# Patient Record
Sex: Male | Born: 1955 | Race: White | Hispanic: No | Marital: Married | Smoking: Never smoker
Health system: Southern US, Community
[De-identification: ages and names within clinical notes are randomized; demographics above are authoritative.]

## PROBLEM LIST (undated history)

## (undated) DIAGNOSIS — N529 Male erectile dysfunction, unspecified: Secondary | ICD-10-CM

## (undated) DIAGNOSIS — M109 Gout, unspecified: Secondary | ICD-10-CM

## (undated) HISTORY — DX: Male erectile dysfunction, unspecified: N52.9

## (undated) HISTORY — DX: Gout, unspecified: M10.9

---

## 2014-10-28 ENCOUNTER — Emergency Department
Admission: EM | Admit: 2014-10-28 | Discharge: 2014-10-28 | Disposition: A | Discharge status: Home / Self Care | Payer: 59 | Attending: Emergency Medicine | Admitting: Emergency Medicine

## 2014-10-28 ENCOUNTER — Emergency Department: Payer: 59

## 2014-10-28 ENCOUNTER — Encounter: Payer: Self-pay | Admitting: General Practice

## 2014-10-28 DIAGNOSIS — S99921A Unspecified injury of right foot, initial encounter: Secondary | ICD-10-CM | POA: Diagnosis present

## 2014-10-28 DIAGNOSIS — Y9289 Other specified places as the place of occurrence of the external cause: Secondary | ICD-10-CM | POA: Diagnosis not present

## 2014-10-28 DIAGNOSIS — W010XXA Fall on same level from slipping, tripping and stumbling without subsequent striking against object, initial encounter: Secondary | ICD-10-CM | POA: Insufficient documentation

## 2014-10-28 DIAGNOSIS — Y9389 Activity, other specified: Secondary | ICD-10-CM | POA: Diagnosis not present

## 2014-10-28 DIAGNOSIS — Y998 Other external cause status: Secondary | ICD-10-CM | POA: Diagnosis not present

## 2014-10-28 DIAGNOSIS — S93401A Sprain of unspecified ligament of right ankle, initial encounter: Secondary | ICD-10-CM | POA: Diagnosis not present

## 2014-10-28 MED ORDER — IBUPROFEN 800 MG PO TABS: 800.0000 mg | ORAL_TABLET | Freq: Three times a day (TID) | ORAL | Status: DC | PRN

## 2014-10-28 MED ORDER — TRAMADOL HCL 50 MG PO TABS: 50.0000 mg | ORAL_TABLET | Freq: Four times a day (QID) | ORAL | Status: DC | PRN

## 2014-10-28 NOTE — ED Notes (Signed)
Pt. Arrived to ed from home. Pt reports falling when stepping out of shower today. Painful right foot/ankle. Swelling noted to right foot/ankle. Pt reports increase pain with movement and placing weight on foot. Pt alert and oriented.

## 2014-10-28 NOTE — Discharge Instructions (Signed)
Ankle Sprain  An ankle sprain is an injury to the strong, fibrous tissues (ligaments) that hold your ankle bones together.   HOME CARE   · Put ice on your ankle for 1-2 days or as told by your doctor.  ¨ Put ice in a plastic bag.  ¨ Place a towel between your skin and the bag.  ¨ Leave the ice on for 15-20 minutes at a time, every 2 hours while you are awake.  · Only take medicine as told by your doctor.  · Raise (elevate) your injured ankle above the level of your heart as much as possible for 2-3 days.  · Use crutches if your doctor tells you to. Slowly put your own weight on the affected ankle. Use the crutches until you can walk without pain.  · If you have a plaster splint:  ¨ Do not rest it on anything harder than a pillow for 24 hours.  ¨ Do not put weight on it.  ¨ Do not get it wet.  ¨ Take it off to shower or bathe.  · If given, use an elastic wrap or support stocking for support. Take the wrap off if your toes lose feeling (numb), tingle, or turn cold or blue.  · If you have an air splint:  ¨ Add or let out air to make it comfortable.  ¨ Take it off at night and to shower and bathe.  ¨ Wiggle your toes and move your ankle up and down often while you are wearing it.  GET HELP IF:  · You have rapidly increasing bruising or puffiness (swelling).  · Your toes feel very cold.  · You lose feeling in your foot.  · Your medicine does not help your pain.  GET HELP RIGHT AWAY IF:   · Your toes lose feeling (numb) or turn blue.  · You have severe pain that is increasing.  MAKE SURE YOU:   · Understand these instructions.  · Will watch your condition.  · Will get help right away if you are not doing well or get worse.  Document Released: 09/24/2007 Document Revised: 08/22/2013 Document Reviewed: 10/20/2011  [employer]® Patient Information ©2015 [employer]. This information is not intended to replace advice given to you by your health care provider. Make sure you discuss any questions you have with your health care  provider.

## 2014-10-28 NOTE — ED Provider Notes (Signed)
[facility] Emergency Department Provider Note  ____________________________________________  Time seen: Approximately 11:27 AM  I have reviewed the triage vital signs and the nursing notes.   HISTORY  Chief Complaint Foot Injury and Foot Pain    HPI Evan Valencia is a 59 y.o. male patient complaining of right foot pain edema status post fall and was stepped on a shower today. Patient stated pain increases was trying to bear weight on her right foot and ankle. She rates his pain as a 10 over 10. Patient stated no palliative measures taken prior to arrival since yesterday. Patient refusing pain medication this time.   History reviewed. No pertinent past medical history.  There are no active problems to display for this patient.   History reviewed. No pertinent past surgical history.  Current Outpatient Rx  Name  Route  Sig  Dispense  Refill  . ibuprofen (ADVIL,MOTRIN) 800 MG tablet   Oral   Take 1 tablet (800 mg total) by mouth every 8 (eight) hours as needed for moderate pain.   15 tablet   0   . traMADol (ULTRAM) 50 MG tablet   Oral   Take 1 tablet (50 mg total) by mouth every 6 (six) hours as needed for moderate pain.   12 tablet   0     Allergies Review of patient's allergies indicates no known allergies.  No family history on file.  Social History History  Substance Use Topics  . Smoking status: Never Smoker   . Smokeless tobacco: Current User    Types: Chew  . Alcohol Use: Yes    Review of Systems Constitutional: No fever/chills Eyes: No visual changes. ENT: No sore throat. Cardiovascular: Denies chest pain. Respiratory: Denies shortness of breath. Gastrointestinal: No abdominal pain.  No nausea, no vomiting.  No diarrhea.  No constipation. Genitourinary: Negative for dysuria. Musculoskeletal: Negative for back pain. Skin: Negative for rash. Neurological: Negative for headaches, focal weakness or numbness. 10-point ROS  otherwise negative.  ____________________________________________   PHYSICAL EXAM:  VITAL SIGNS: ED Triage Vitals  Enc Vitals Group     BP 10/28/14 1123 156/101 mmHg     Pulse Rate 10/28/14 1121 80     Resp 10/28/14 1121 18     Temp 10/28/14 1121 98.5 F (36.9 C)     Temp Source 10/28/14 1121 Oral     SpO2 10/28/14 1121 97 %     Weight 10/28/14 1121 200 lb (90.719 kg)     Height 10/28/14 1121 6' (1.829 m)     Head Cir --      Peak Flow --      Pain Score 10/28/14 1122 10     Pain Loc --      Pain Edu? --      Excl. in GC? --     Constitutional: Alert and oriented. Well appearing and in no acute distress. Eyes: Conjunctivae are normal. PERRL. EOMI. Head: Atraumatic. Nose: No congestion/rhinnorhea. Mouth/Throat: Mucous membranes are moist.  Oropharynx non-erythematous. Neck: No stridor.  No cervical spine tenderness to palpation. Hematological/Lymphatic/Immunilogical: No cervical lymphadenopathy. Cardiovascular: Normal rate, regular rhythm. Grossly normal heart sounds.  Good peripheral circulation. Elevated BP Respiratory: Normal respiratory effort.  No retractions. Lungs CTAB. Gastrointestinal: Soft and nontender. No distention. No abdominal bruits. No CVA tenderness. Musculoskeletal: No obvious deformity to the right foot ankle. Moderate edema is apparent. Neurovascular intact. Decreased range of motion with inversion secondary to complain of pain. Patient refused to bear weight. Neurologic:  Normal speech and language. No gross focal neurologic deficits are appreciated. Speech is normal. No gait instability. Skin:  Skin is warm, dry and intact. No rash noted. Psychiatric: Mood and affect are normal. Speech and behavior are normal.  ____________________________________________   LABS (all labs ordered are listed, but only abnormal results are displayed)  Labs Reviewed - No data to  display ____________________________________________  EKG   ____________________________________________  RADIOLOGY no acute findings I, Joni Reining, personally viewed and evaluated these images as part of my medical decision making.   ____________________________________________   PROCEDURES  Procedure(s) performed: None  Critical Care performed: No  ____________________________________________   INITIAL IMPRESSION / ASSESSMENT AND PLAN / ED COURSE  Pertinent labs & imaging results that were available during my care of the patient were reviewed by me and considered in my medical decision making (see chart for details).  Sprain right ankle. Patient placed in a Velcro ankle splint and given crutches for ambulation. Patient given prescription for tramadol and ibuprofen to use as directed. Patient advised follow-up with his family doctor. ____________________________________________   FINAL CLINICAL IMPRESSION(S) / ED DIAGNOSES  Final diagnoses:  Right ankle sprain, initial encounter      Joni Reining, PA-C 10/28/14 1237  Minna Antis, MD 10/28/14 1529

## 2017-01-07 IMAGING — CR DG ANKLE COMPLETE 3+V*R*
1 series · 3 of 3 positions shown · non-contrast
Comparison: None.

CLINICAL DATA: Fall on [REDACTED] getting out of shower, ankle
pain/swelling, history of fracture 25 years ago

EXAM:
RIGHT ANKLE - COMPLETE 3+ VIEW

[Series 1: dg ankle complete right · 0.14mm/px · 3 of 3 slices shown]
[im 1/3]
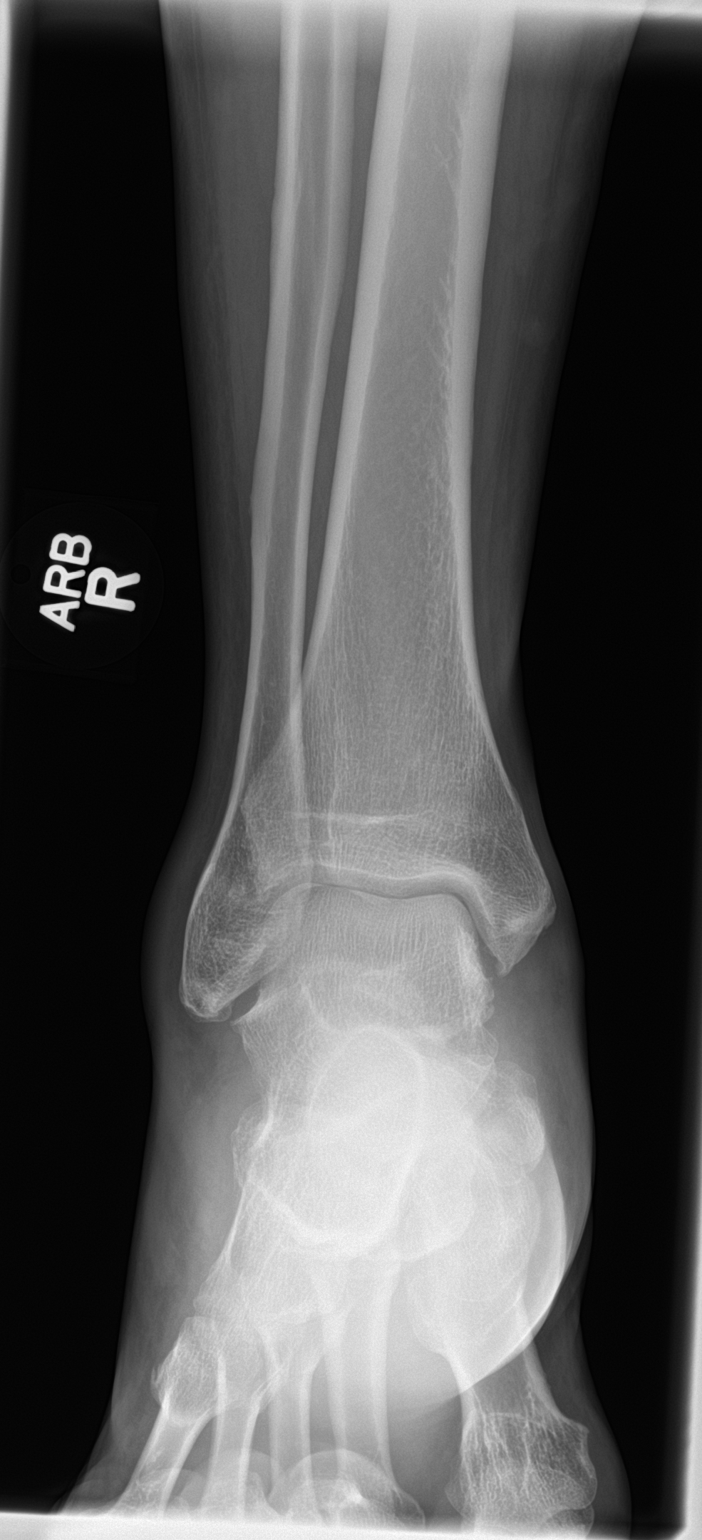
[im 2/3]
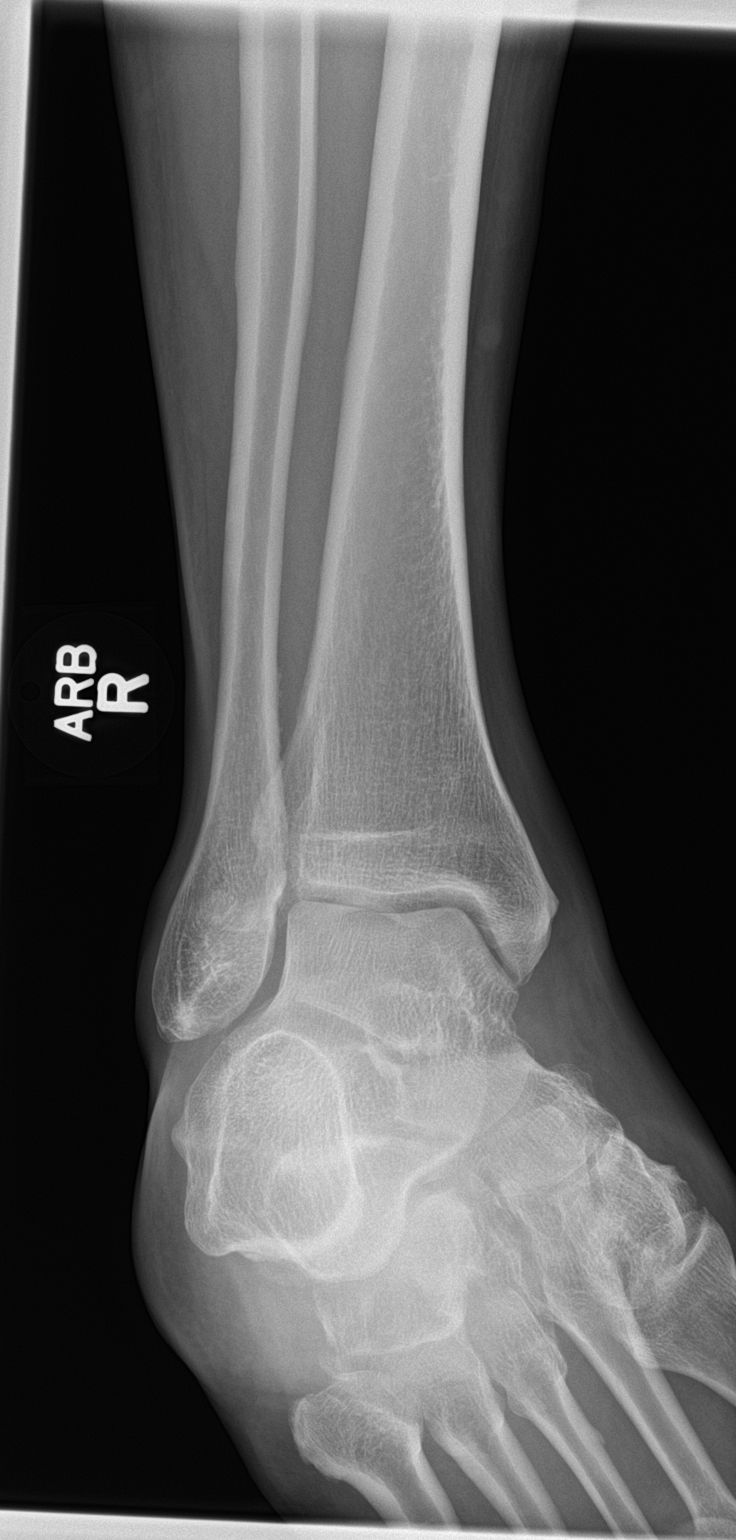
[im 3/3]
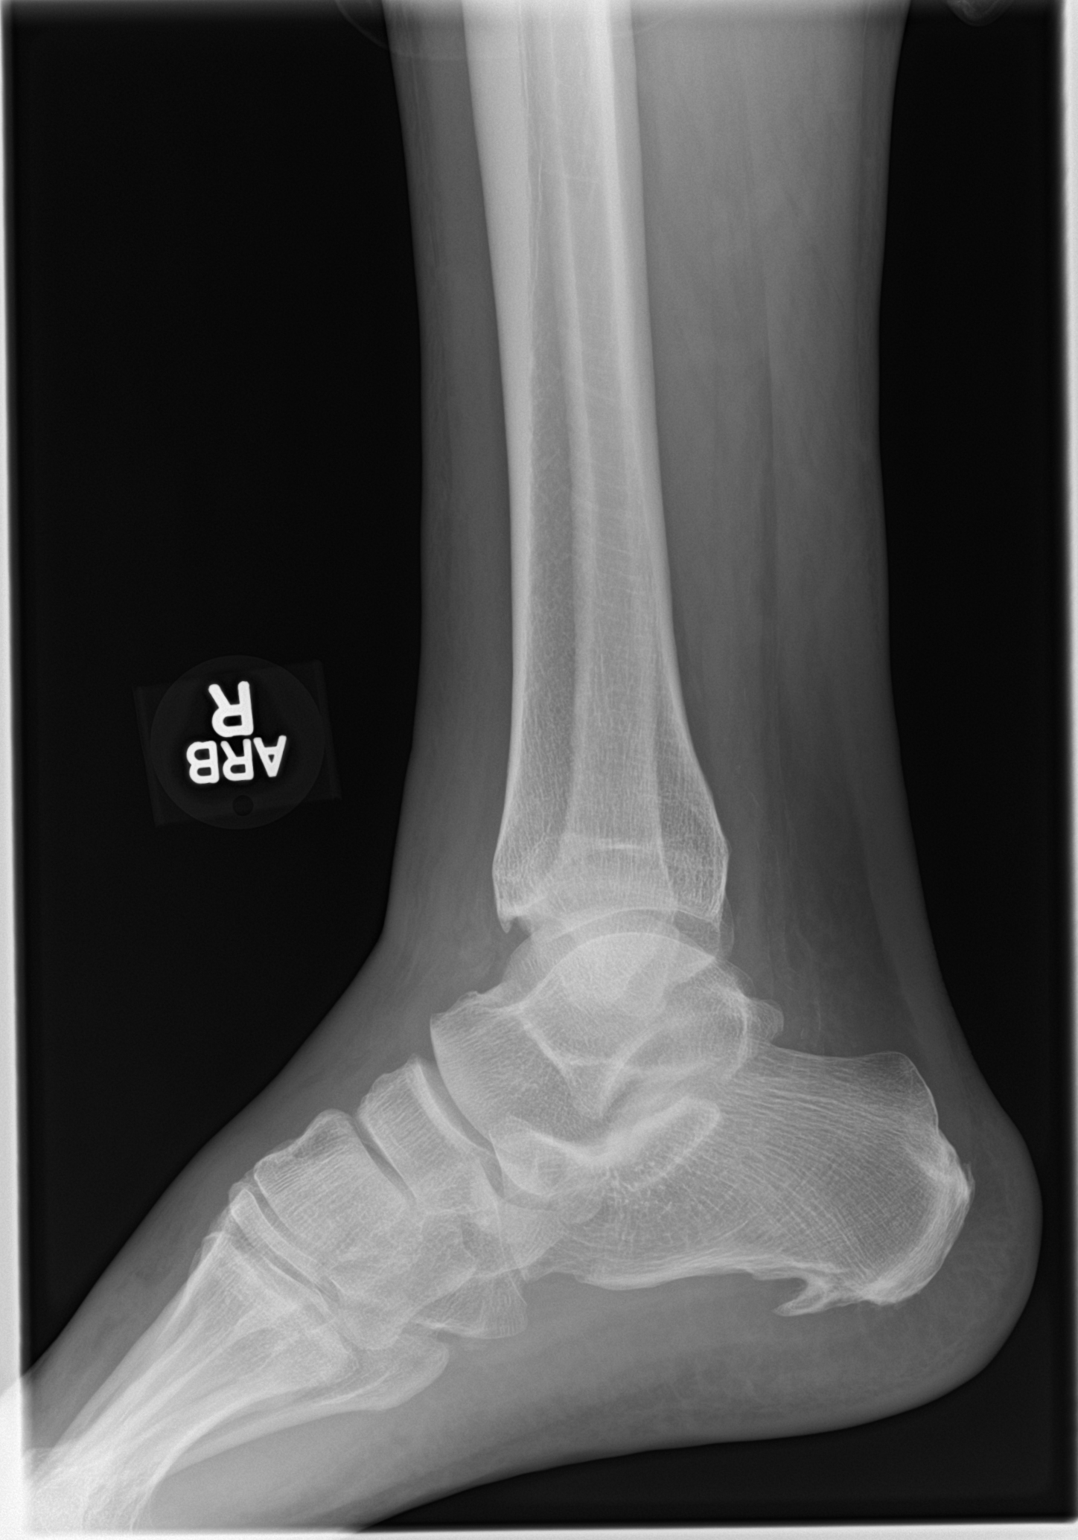

[3 of 3 positions shown; findings below may reference images not displayed]

FINDINGS: No fracture or dislocation is seen.

Mild tibiotalar degenerative changes.

The base of the fifth metatarsal is unremarkable.

Moderate plantar calcaneal enthesophyte.

Mild dorsal/lateral soft tissue swelling.
IMPRESSION: No fracture or dislocation is seen.

Mild tibiotalar degenerative changes.

Mild soft tissue swelling.

## 2021-07-10 ENCOUNTER — Emergency Department: Payer: No Typology Code available for payment source

## 2021-07-10 ENCOUNTER — Emergency Department
Admission: EM | Admit: 2021-07-10 | Discharge: 2021-07-10 | Disposition: A | Discharge status: Home / Self Care | Payer: No Typology Code available for payment source | Attending: Emergency Medicine | Admitting: Emergency Medicine

## 2021-07-10 ENCOUNTER — Encounter: Payer: Self-pay | Admitting: Emergency Medicine

## 2021-07-10 ENCOUNTER — Other Ambulatory Visit: Payer: Self-pay

## 2021-07-10 DIAGNOSIS — M109 Gout, unspecified: Secondary | ICD-10-CM | POA: Insufficient documentation

## 2021-07-10 DIAGNOSIS — M25462 Effusion, left knee: Secondary | ICD-10-CM | POA: Diagnosis not present

## 2021-07-10 DIAGNOSIS — R600 Localized edema: Secondary | ICD-10-CM | POA: Diagnosis not present

## 2021-07-10 DIAGNOSIS — M25562 Pain in left knee: Secondary | ICD-10-CM | POA: Diagnosis present

## 2021-07-10 MED ORDER — PREDNISONE 10 MG PO TABS: ORAL_TABLET | ORAL | 0 refills | Status: AC

## 2021-07-10 MED ORDER — LIDOCAINE HCL (PF) 1 % IJ SOLN
5.0000 mL | Freq: Once | INTRAMUSCULAR | Status: AC
  Administered 2021-07-10: 5 mL
  Filled 2021-07-10: qty 5

## 2021-07-10 NOTE — ED Provider Notes (Signed)
? ?[facility] ?Provider Note ? ? ? Event Date/Time  ? First MD Initiated Contact with Patient 07/10/21 1234   ?  (approximate) ? ? ?History  ? ?Chief Complaint ?Knee Pain ? ? ?HPI ? ?Evan Valencia is a 66 y.o. male with past medical history of gout who presents to the ED complaining of knee pain.  Patient reports that he has been dealing with increasing pain and swelling to his left knee for the past 3 days.  He states that the knee feels hot to touch and similar to prior gout flares.  He states that multiple years ago he was on maintenance medication for gout but has not taken anything recently and does not currently have a PCP.  He has been taking ibuprofen at home without significant relief.  He denies any recent trauma to the knee and has not had any fevers or redness at the knee.  He denies pain or swelling at any other joints. ?  ? ? ?Physical Exam  ? ?Triage Vital Signs: ?ED Triage Vitals  ?Enc Vitals Group  ?   BP 07/10/21 1234 (!) 167/89  ?   Pulse Rate 07/10/21 1234 94  ?   Resp 07/10/21 1234 18  ?   Temp 07/10/21 1234 98.1 ?F (36.7 ?C)  ?   Temp Source 07/10/21 1234 Oral  ?   SpO2 07/10/21 1229 95 %  ?   Weight 07/10/21 1231 240 lb (108.9 kg)  ?   Height 07/10/21 1231 6' (1.829 m)  ?   Head Circumference --   ?   Peak Flow --   ?   Pain Score 07/10/21 1231 10  ?   Pain Loc --   ?   Pain Edu? --   ?   Excl. in GC? --   ? ? ?Most recent vital signs: ?Vitals:  ? 07/10/21 1229 07/10/21 1234  ?BP:  (!) 167/89  ?Pulse:  94  ?Resp:  18  ?Temp:  98.1 ?F (36.7 ?C)  ?SpO2: 95% 98%  ? ? ?Constitutional: Alert and oriented. ?Eyes: Conjunctivae are normal. ?Head: Atraumatic. ?Nose: No congestion/rhinnorhea. ?Mouth/Throat: Mucous membranes are moist.  ?Cardiovascular: Normal rate, regular rhythm. Grossly normal heart sounds.  2+ radial pulses bilaterally. ?Respiratory: Normal respiratory effort.  No retractions. Lungs CTAB. ?Gastrointestinal: Soft and nontender. No  distention. ?Musculoskeletal: Edema, warmth, and diffuse tenderness noted at left knee with significant effusion.  Range of motion at left knee limited secondary to pain however able to range with some discomfort. ?Neurologic:  Normal speech and language. No gross focal neurologic deficits are appreciated. ? ? ? ?ED Results / Procedures / Treatments  ? ?Labs ?(all labs ordered are listed, but only abnormal results are displayed) ?Labs Reviewed - No data to display ? ? ?PROCEDURES: ? ?Critical Care performed: No ? ?.Joint Aspiration/Arthrocentesis ? ?Date/Time: 07/10/2021 1:42 PM ?Performed by: Chesley Noon, MD ?Authorized by: Chesley Noon, MD  ? ?Consent:  ?  Consent obtained:  Verbal ?  Consent given by:  Patient ?  Risks, benefits, and alternatives were discussed: yes   ?  Risks discussed:  Bleeding, infection, pain, poor cosmetic result, nerve damage and incomplete drainage ?  Alternatives discussed:  Alternative treatment, referral and no treatment ?Universal protocol:  ?  Procedure explained and questions answered to patient or proxy's satisfaction: yes   ?  Relevant documents present and verified: yes   ?  Test results available: yes   ?  Imaging studies available: yes   ?  Required blood products, implants, devices, and special equipment available: yes   ?  Site/side marked: yes   ?  Immediately prior to procedure, a time out was called: yes   ?  Patient identity confirmed:  Verbally with patient and arm band ?Location:  ?  Location:  Knee ?  Knee:  L knee ?Anesthesia:  ?  Anesthesia method:  Local infiltration ?  Local anesthetic:  Lidocaine 1% w/o epi ?Procedure details:  ?  Preparation: Patient was prepped and draped in usual sterile fashion   ?  Needle gauge:  18 G ?  Ultrasound guidance: no   ?  Approach:  Medial ?  Aspirate amount:  32 cc ?  Aspirate characteristics:  Serous and yellow ?  Steroid injected: no   ?  Specimen collected: no   ?Post-procedure details:  ?  Dressing:  Sterile dressing and  gauze roll ?  Procedure completion:  Tolerated well, no immediate complications ? ? ?MEDICATIONS ORDERED IN ED: ?Medications  ?lidocaine (PF) (XYLOCAINE) 1 % injection 5 mL (5 mLs Infiltration Given 07/10/21 1315)  ? ? ? ?IMPRESSION / MDM / ASSESSMENT AND PLAN / ED COURSE  ?I reviewed the triage vital signs and the nursing notes. ?             ?               ? ?66 y.o. male with past medical history of gout presents to the ED with 3 days of increasing pain and swelling to his left knee. ? ?Differential diagnosis includes, but is not limited to, gouty arthritis, septic arthritis, osteoarthritis, knee effusion. ? ?Patient is nontoxic-appearing and in no acute distress, is neurovascular intact to his left lower extremity but does have significant swelling, tenderness, and warmth to his left knee.  Given significant effusion on exam, arthrocentesis was performed for therapeutic purposes.  Very low suspicion for septic arthritis at this time given there is no erythema or fever and patient is able to range his left knee although with significant discomfort.  He is appropriate for discharge home with PCP follow-up, will be prescribed a course of steroids and was counseled to return to the ED for new worsening symptoms.  Patient agrees with plan. ? ?  ? ? ?FINAL CLINICAL IMPRESSION(S) / ED DIAGNOSES  ? ?Final diagnoses:  ?Effusion of left knee  ?Acute gout of left knee, unspecified cause  ? ? ? ?Rx / DC Orders  ? ?ED Discharge Orders   ? ?      Ordered  ?  predniSONE (DELTASONE) 10 MG tablet       ? 07/10/21 1341  ? ?  ?  ? ?  ? ? ? ?Note:  This document was prepared using Dragon voice recognition software and may include unintentional dictation errors. ?  ?Chesley Noon, MD ?07/10/21 1346 ? ?

## 2021-07-10 NOTE — ED Triage Notes (Signed)
Patient to ED via [place] from home for left knee pain for the past 3 days. Patient has a history of gout which he has not recently been taking medication for. Redness and swelling noted behind the knee per EMS. ?

## 2021-07-30 ENCOUNTER — Ambulatory Visit: Payer: Self-pay | Admitting: *Deleted

## 2021-07-30 NOTE — Telephone Encounter (Signed)
Left voicemail on cell number as well as home number. Will route to office. ?

## 2021-07-30 NOTE — Telephone Encounter (Addendum)
Left voicemail. He is a new pt. Not sure can be seen earlier but could go to [facility]. ?

## 2021-07-30 NOTE — Telephone Encounter (Signed)
Summary: gout/  ? Wife Evan Valencia calling to ask if there is any way pt can be worked in sooner for an appt.  She says he has gout in his legs.  They had to go to ED a few weeks ago, but pt really needs to be on medication for gout 24/7.  She said "he is getting worse". She did say he can walk, but it is difficult.  Please advise   ?  ? ? ?Called patient to review sx of gout. No answer, LVMTCB [phone]. ?

## 2021-08-29 NOTE — Progress Notes (Signed)
? ?New Patient Office Visit ? ? ?I,[name],acting as a Neurosurgeon for [employer], PA-C.,have documented all relevant documentation on the behalf of Evan Lat, PA-C,as directed by  [employer], PA-C while in the presence of [employer], PA-C. ? ? ?Subjective   ? ?Patient ID: Evan Valencia, male    DOB: 03/22/1956  Age: 66 y.o. MRN: [id number] ? ?CC:  ?Chief Complaint  ?Patient presents with  ? Establish Care  ?     Gout ? ?HPI ?[name] presents to establish care and for management of gout ?Per chart review, patient was seen at the ED on 07/10/21 for evaluation of increasing pain and swelling to his left knee, was diagnosed with acute gout of left knee. Arthrocentesis was performed and patient was d/charged with prednisone Rx ?His wife is accompanied patient. ?Reports that patient has been having some troubles with erection. Patient requests a medication for ED ? ?Outpatient Encounter Medications as of 08/30/2021  ?Medication Sig  ? [DISCONTINUED] ibuprofen (ADVIL,MOTRIN) 800 MG tablet Take 1 tablet (800 mg total) by mouth every 8 (eight) hours as needed for moderate pain. (Patient not taking: Reported on 08/30/2021)  ? [DISCONTINUED] traMADol (ULTRAM) 50 MG tablet Take 1 tablet (50 mg total) by mouth every 6 (six) hours as needed for moderate pain. (Patient not taking: Reported on 08/30/2021)  ? ?No facility-administered encounter medications on file as of 08/30/2021.  ? ? ?Past Medical History:  ?Diagnosis Date  ? ED (erectile dysfunction)   ? Gout   ? ? ?History reviewed. No pertinent surgical history. ? ?Family History  ?Problem Relation Age of Onset  ? Diabetes Mother   ? Diabetes Sister   ? Cancer Brother   ?     Prostate  ? ? ?Social History  ? ?Socioeconomic History  ? Marital status: Married  ?  Spouse name: Not on file  ? Number of children: Not on file  ? Years of education: Not on file  ? Highest education level: Not on file  ?Occupational History  ? Not on file  ?Tobacco Use  ? Smoking  status: Never  ?  Passive exposure: Never  ? Smokeless tobacco: Current  ?  Types: Chew  ?Vaping Use  ? Vaping Use: Never used  ?Substance and Sexual Activity  ? Alcohol use: Yes  ? Drug use: No  ? Sexual activity: Not on file  ?Other Topics Concern  ? Not on file  ?Social History Narrative  ? Not on file  ? ?Social Determinants of Health  ? ?Financial Resource Strain: Not on file  ?Food Insecurity: Not on file  ?Transportation Needs: Not on file  ?Physical Activity: Not on file  ?Stress: Not on file  ?Social Connections: Not on file  ?Intimate Partner Violence: Not on file  ? ? ?Review of Systems  ?Musculoskeletal:  Positive for joint pain.  ?All other systems reviewed and are negative. ? ?  ? ? ?Objective   ? ?BP 137/80 (BP Location: Right Arm, Patient Position: Sitting, Cuff Size: Large)   Pulse 73   Resp 16   Ht 6' (1.829 m)   Wt 210 lb (95.3 kg)   SpO2 99%   BMI 28.48 kg/m?  ? ?Physical Exam ?Vitals reviewed.  ?Constitutional:   ?   Appearance: Normal appearance.  ?HENT:  ?   Head: Normocephalic and atraumatic.  ?Cardiovascular:  ?   Rate and Rhythm: Normal rate and regular rhythm.  ?   Pulses: Normal pulses.  ?  Heart sounds: Normal heart sounds.  ?Pulmonary:  ?   Effort: Pulmonary effort is normal.  ?   Breath sounds: Normal breath sounds.  ?Musculoskeletal:     ?   General: No swelling or tenderness.  ?   Right lower leg: No edema.  ?   Left lower leg: No edema.  ?Neurological:  ?   General: No focal deficit present.  ?   Mental Status: He is alert and oriented to person, place, and time.  ?   Sensory: No sensory deficit.  ?   Motor: No weakness.  ?Psychiatric:     ?   Behavior: Behavior normal.     ?   Thought Content: Thought content normal.     ?   Judgment: Judgment normal.  ? ? ? ?  ? ?Assessment & Plan:  ? ?Problem List Items Addressed This Visit   ?None ?1. Erectile dysfunction, unspecified erectile dysfunction type ?Might Rxed Cialis ?Recommended to have a CPE first before he could be Rx any  medication ?Patient saw a medical provider ~10 years ago ?Labs were done some time ago ? ?2. Acute idiopathic gout, unspecified site ?Recommended supportive care including ice packs, rest, and adequate nutrition and hydration  ?- Uric acid ?- Basic metabolic panel ?Might consider allopurinol or probenecid of colchicine with naproxen or prednisone until urate goal of < 6 ? ?CPE/FU as scheduled ?The patient was advised to call back or seek an in-person evaluation if the symptoms worsen or if the condition fails to improve as anticipated. ? ?I discussed the assessment and treatment plan with the patient. The patient was provided an opportunity to ask questions and all were answered. The patient agreed with the plan and demonstrated an understanding of the instructions. ? ?The entirety of the information documented in the History of Present Illness, Review of Systems and Physical Exam were personally obtained by me. Portions of this information were initially documented by the CMA and reviewed by me for thoroughness and accuracy.  ?Portions of this note were created using dictation software and may contain typographical errors.  ? ? ?April Hyacinth Meeker, CMA ? ? ?

## 2021-08-30 ENCOUNTER — Encounter: Payer: Self-pay | Admitting: Physician Assistant

## 2021-08-30 ENCOUNTER — Ambulatory Visit (INDEPENDENT_AMBULATORY_CARE_PROVIDER_SITE_OTHER): Payer: No Typology Code available for payment source | Admitting: Physician Assistant

## 2021-08-30 VITALS — BP 137/80 | HR 73 | Resp 16 | Ht 72.0 in | Wt 210.0 lb

## 2021-08-30 DIAGNOSIS — N529 Male erectile dysfunction, unspecified: Secondary | ICD-10-CM

## 2021-08-30 DIAGNOSIS — M1000 Idiopathic gout, unspecified site: Secondary | ICD-10-CM | POA: Diagnosis not present

## 2021-08-31 DIAGNOSIS — M1000 Idiopathic gout, unspecified site: Secondary | ICD-10-CM | POA: Insufficient documentation

## 2021-08-31 DIAGNOSIS — N529 Male erectile dysfunction, unspecified: Secondary | ICD-10-CM | POA: Insufficient documentation

## 2021-08-31 LAB — BASIC METABOLIC PANEL
BUN/Creatinine Ratio: 17 (ref 10–24)
BUN: 15 mg/dL (ref 8–27)
CO2: 23 mmol/L (ref 20–29)
Calcium: 9.5 mg/dL (ref 8.6–10.2)
Chloride: 105 mmol/L (ref 96–106)
Creatinine, Ser: 0.87 mg/dL (ref 0.76–1.27)
Glucose: 82 mg/dL (ref 70–99)
Potassium: 4.6 mmol/L (ref 3.5–5.2)
Sodium: 142 mmol/L (ref 134–144)
eGFR: 96 mL/min/{1.73_m2} (ref >59)

## 2021-08-31 LAB — URIC ACID: Uric Acid: 7.2 mg/dL (ref 3.8–8.4)

## 2021-09-05 NOTE — Progress Notes (Signed)
   Complete physical exam   Patient: Evan Valencia   DOB: 03/27/1956   65 y.o. Male  MRN: [id number] Visit Date: 09/06/2021  Today's healthcare provider: Janna Ostwalt, PA-C   Chief Complaint  Patient presents with   Annual Exam   Subjective     HPI Evan Valencia is a 65 y.o. male who presents today for a complete physical exam.  He reports consuming a general diet. The patient has a physically strenuous job, but has no regular exercise apart from work.  He generally feels well. He reports sleeping well. He does not have additional problems to discuss today.   Has problems with starting and maintaining erection. Denies having any urinary symptoms Has been taking Viagra in the past  Has been having a gout flare on 07/10/21 Per chart review, patient was seen at the ED on 07/10/21 for evaluation of increasing pain and swelling to his left knee, was diagnosed with acute gout of left knee. Arthrocentesis was performed and patient was d/charged with prednisone Rx Requests medications for gout control  Past Medical History:  Diagnosis Date   ED (erectile dysfunction)    Gout    No past surgical history on file. Social History   Socioeconomic History   Marital status: Married    Spouse name: Not on file   Number of children: Not on file   Years of education: Not on file   Highest education level: Not on file  Occupational History   Not on file  Tobacco Use   Smoking status: Never    Passive exposure: Never   Smokeless tobacco: Current    Types: Chew  Vaping Use   Vaping Use: Never used  Substance and Sexual Activity   Alcohol use: Yes   Drug use: No   Sexual activity: Not on file  Other Topics Concern   Not on file  Social History Narrative   Not on file   Social Determinants of Health   Financial Resource Strain: Not on file  Food Insecurity: Not on file  Transportation Needs: Not on file  Physical Activity: Not on file  Stress: Not on file  Social  Connections: Not on file  Intimate Partner Violence: Not on file   Family Status  Relation Name Status   Mother  (Not Specified)   Sister  (Not Specified)   Brother  (Not Specified)   Family History  Problem Relation Age of Onset   Diabetes Mother    Diabetes Sister    Cancer Brother        Prostate   No Known Allergies  Patient Care Team: Ostwalt, Janna, PA-C as PCP - General (Physician Assistant)   Medications: No outpatient medications prior to visit.   No facility-administered medications prior to visit.    Review of Systems  Constitutional: Negative.   HENT: Negative.    Eyes: Negative.   Respiratory: Negative.    Cardiovascular: Negative.   Gastrointestinal: Negative.   Endocrine: Negative.   Genitourinary: Negative.   Musculoskeletal:  Positive for back pain.  Skin: Negative.   Allergic/Immunologic: Negative.   Neurological: Negative.   Hematological:  Bruises/bleeds easily.  Psychiatric/Behavioral: Negative.    All other systems reviewed and are negative.  Last metabolic panel Lab Results  Component Value Date   GLUCOSE 96 09/06/2021   NA 145 (H) 09/06/2021   K 5.1 09/06/2021   CL 106 09/06/2021   CO2 23 09/06/2021   BUN 13 09/06/2021   CREATININE 0.82   09/06/2021   EGFR 98 09/06/2021   CALCIUM 9.5 09/06/2021   PROT 7.0 09/06/2021   ALBUMIN 4.4 09/06/2021   LABGLOB 2.6 09/06/2021   AGRATIO 1.7 09/06/2021   BILITOT 0.4 09/06/2021   ALKPHOS 105 09/06/2021   AST 25 09/06/2021   ALT 19 09/06/2021      Objective     BP 140/78 (BP Location: Right Arm, Patient Position: Sitting, Cuff Size: Large)   Pulse 67   Temp 97.9 F (36.6 C) (Oral)   Resp 16   Ht 6' (1.829 m)   Wt 212 lb 11.2 oz (96.5 kg)   SpO2 100%   BMI 28.85 kg/m  BP Readings from Last 3 Encounters:  09/06/21 140/78  08/30/21 137/80  07/10/21 (!) 167/89   Wt Readings from Last 3 Encounters:  09/06/21 212 lb 11.2 oz (96.5 kg)  08/30/21 210 lb (95.3 kg)  07/10/21 240 lb  (108.9 kg)       Physical Exam Vitals reviewed.  Constitutional:      General: He is not in acute distress.    Appearance: Normal appearance. He is well-developed. He is not ill-appearing or diaphoretic.  HENT:     Head: Normocephalic and atraumatic.     Right Ear: Tympanic membrane, ear canal and external ear normal.     Left Ear: Tympanic membrane, ear canal and external ear normal.     Nose: Nose normal.     Mouth/Throat:     Mouth: Mucous membranes are moist.     Pharynx: Oropharynx is clear. No oropharyngeal exudate or posterior oropharyngeal erythema.  Eyes:     General: No scleral icterus.       Right eye: No discharge.        Left eye: No discharge.     Conjunctiva/sclera: Conjunctivae normal.     Pupils: Pupils are equal, round, and reactive to light.  Neck:     Thyroid: No thyromegaly.  Cardiovascular:     Rate and Rhythm: Normal rate and regular rhythm.     Pulses: Normal pulses.     Heart sounds: Normal heart sounds. No murmur heard. Pulmonary:     Effort: Pulmonary effort is normal. No respiratory distress.     Breath sounds: Normal breath sounds. No wheezing or rales.  Abdominal:     General: Abdomen is flat. Bowel sounds are normal. There is no distension.     Palpations: Abdomen is soft.     Tenderness: There is no abdominal tenderness.  Musculoskeletal:        General: No deformity.     Cervical back: Neck supple.     Right lower leg: No edema.     Left lower leg: No edema.  Lymphadenopathy:     Cervical: No cervical adenopathy.  Skin:    General: Skin is warm and dry.     Capillary Refill: Capillary refill takes less than 2 seconds.     Findings: No rash.  Neurological:     General: No focal deficit present.     Mental Status: He is alert and oriented to person, place, and time. Mental status is at baseline.     Sensory: No sensory deficit.     Motor: No weakness.     Gait: Gait normal.  Psychiatric:        Mood and Affect: Mood normal.         Behavior: Behavior normal.        Thought Content: Thought content normal.          Judgment: Judgment normal.    Last depression screening scores    08/30/2021    2:09 PM  PHQ 2/9 Scores  PHQ - 2 Score 0  PHQ- 9 Score 0   Last fall risk screening    08/30/2021    2:09 PM  Fall Risk   Falls in the past year? 1  Number falls in past yr: 1  Comment 2  Injury with Fall? 0  Risk for fall due to : History of fall(s)  Follow up Falls evaluation completed   Last Audit-C alcohol use screening    08/30/2021    2:08 PM  Alcohol Use Disorder Test (AUDIT)  1. How often do you have a drink containing alcohol? 4  2. How many drinks containing alcohol do you have on a typical day when you are drinking? 0  3. How often do you have six or more drinks on one occasion? 0  AUDIT-C Score 4  4. How often during the last year have you found that you were not able to stop drinking once you had started? 0  5. How often during the last year have you failed to do what was normally expected from you because of drinking? 0  6. How often during the last year have you needed a first drink in the morning to get yourself going after a heavy drinking session? 0  7. How often during the last year have you had a feeling of guilt of remorse after drinking? 0  8. How often during the last year have you been unable to remember what happened the night before because you had been drinking? 0  9. Have you or someone else been injured as a result of your drinking? 0  10. Has a relative or friend or a doctor or another health worker been concerned about your drinking or suggested you cut down? 0  Alcohol Use Disorder Identification Test Final Score (AUDIT) 4   A score of 3 or more in women, and 4 or more in men indicates increased risk for alcohol abuse, EXCEPT if all of the points are from question 1   Results for orders placed or performed in visit on 09/06/21  CBC  Result Value Ref Range   WBC 7.1 3.4 - 10.8  x10E3/uL   RBC 4.85 4.14 - 5.80 x10E6/uL   Hemoglobin 14.8 13.0 - 17.7 g/dL   Hematocrit 44.9 37.5 - 51.0 %   MCV 93 79 - 97 fL   MCH 30.5 26.6 - 33.0 pg   MCHC 33.0 31.5 - 35.7 g/dL   RDW 13.3 11.6 - 15.4 %   Platelets 271 150 - 450 x10E3/uL  PSA  Result Value Ref Range   Prostate Specific Ag, Serum 1.1 0.0 - 4.0 ng/mL  TSH  Result Value Ref Range   TSH 2.650 0.450 - 4.500 uIU/mL  Lipid panel  Result Value Ref Range   Cholesterol, Total 188 100 - 199 mg/dL   Triglycerides 113 0 - 149 mg/dL   HDL 42 >39 mg/dL   VLDL Cholesterol Cal 20 5 - 40 mg/dL   LDL Chol Calc (NIH) 126 (H) 0 - 99 mg/dL   Chol/HDL Ratio 4.5 0.0 - 5.0 ratio  Hepatitis C Antibody  Result Value Ref Range   Hep C Virus Ab Non Reactive Non Reactive  Hemoglobin A1c  Result Value Ref Range   Hgb A1c MFr Bld 5.4 4.8 - 5.6 %   Est. average glucose Bld gHb Est-mCnc 108 mg/dL    Comprehensive metabolic panel  Result Value Ref Range   Glucose 96 70 - 99 mg/dL   BUN 13 8 - 27 mg/dL   Creatinine, Ser 0.82 0.76 - 1.27 mg/dL   eGFR 98 >59 mL/min/1.73   BUN/Creatinine Ratio 16 10 - 24   Sodium 145 (H) 134 - 144 mmol/L   Potassium 5.1 3.5 - 5.2 mmol/L   Chloride 106 96 - 106 mmol/L   CO2 23 20 - 29 mmol/L   Calcium 9.5 8.6 - 10.2 mg/dL   Total Protein 7.0 6.0 - 8.5 g/dL   Albumin 4.4 3.8 - 4.8 g/dL   Globulin, Total 2.6 1.5 - 4.5 g/dL   Albumin/Globulin Ratio 1.7 1.2 - 2.2   Bilirubin Total 0.4 0.0 - 1.2 mg/dL   Alkaline Phosphatase 105 44 - 121 IU/L   AST 25 0 - 40 IU/L   ALT 19 0 - 44 IU/L    Assessment & Plan    Routine Health Maintenance and Physical Exam  Exercise Activities and Dietary recommendations  Goals   None     Reviewed patient's Family Medical History Reviewed and updated list of patient's medical providers Established a written schedule for health screening services Health Risk Assessent Completed and Reviewed     Immunization History  Administered Date(s) Administered   Tdap  09/06/2021   Zoster Recombinat (Shingrix) 09/06/2021    Health Maintenance  Topic Date Due   COVID-19 Vaccine (1) 09/22/2021 (Originally 03/13/1956)   HIV Screening  09/09/2022 (Originally 09/11/1971)   COLONOSCOPY (Pts 45-32yr Insurance coverage will need to be confirmed)  10/10/2022 (Originally 09/10/2001)   Zoster Vaccines- Shingrix (2 of 2) 11/01/2021   INFLUENZA VACCINE  11/19/2021   TETANUS/TDAP  09/07/2031   Hepatitis C Screening  Completed   HPV VACCINES  Aged Out    Discussed health benefits of physical activity, and encouraged him to engage in regular exercise appropriate for his age and condition.  1. Encounter for annual health examination  - Tdap vaccine greater than or equal to 7yo IM - Varicella-zoster vaccine IM - CBC - PSA - TSH - Lipid panel - Hepatitis C Antibody - Hemoglobin A1c - Comprehensive metabolic panel  2. Need for Tdap vaccination  - Tdap vaccine greater than or equal to 7yo IM  3. Need for shingles vaccine  - Varicella-zoster vaccine IM  4. Encounter for hepatitis C screening test for low risk patient  - Hepatitis C Antibody  5. Idiopathic gout, unspecified chronicity, unspecified site  - colchicine 0.6 MG tablet; Take 1 tablet (0.6 mg total) by mouth daily.  Dispense: 30 tablet; Refill: 0 - allopurinol (ZYLOPRIM) 100 MG tablet; Take 1 tablet (100 mg total) by mouth daily.  Dispense: 30 tablet; Refill: 6 - CBC FU in 1 mo/BMP and uric acid  6. Erectile dysfunction, unspecified erectile dysfunction type  - tadalafil (CIALIS) 5 MG tablet; Take 1 tablet (5 mg total) by mouth daily.  Dispense: 30 tablet; Refill: 0 - Lipid panel - Hemoglobin A1c FU in 1 mo  7. Prostate cancer screening  - PSA  8. Tobacco chew use Strongly recommended tobacco cessation. Patient is reluctant. Risks of tobacco chew use was explained.  9. Alcohol use Strongly encouraged on alcohol cessation  Patient is reluctant. - TSH - Comprehensive metabolic  panel  10. Screening for colon cancer Will provide screening test at his next visit if he agrees.  FU in 1 mo for colon cancer screening, med screening    The patient was advised  to call back or seek an in-person evaluation if the symptoms worsen or if the condition fails to improve as anticipated.  I discussed the assessment and treatment plan with the patient. The patient was provided an opportunity to ask questions and all were answered. The patient agreed with the plan and demonstrated an understanding of the instructions.  The entirety of the information documented in the History of Present Illness, Review of Systems and Physical Exam were personally obtained by me. Portions of this information were initially documented by the CMA and reviewed by me for thoroughness and accuracy.  Portions of this note were created using dictation software and may contain typographical errors.     Janna Ostwalt, PA-C  [facility] [phone] (phone) [phone] (fax)  [facility]   

## 2021-09-06 ENCOUNTER — Encounter: Payer: Self-pay | Admitting: Physician Assistant

## 2021-09-06 ENCOUNTER — Ambulatory Visit (INDEPENDENT_AMBULATORY_CARE_PROVIDER_SITE_OTHER): Payer: No Typology Code available for payment source | Admitting: Physician Assistant

## 2021-09-06 VITALS — BP 140/78 | HR 67 | Temp 97.9°F | Resp 16 | Ht 72.0 in | Wt 212.7 lb

## 2021-09-06 DIAGNOSIS — Z23 Encounter for immunization: Secondary | ICD-10-CM | POA: Diagnosis not present

## 2021-09-06 DIAGNOSIS — Z1159 Encounter for screening for other viral diseases: Secondary | ICD-10-CM | POA: Diagnosis not present

## 2021-09-06 DIAGNOSIS — Z720 Tobacco use: Secondary | ICD-10-CM

## 2021-09-06 DIAGNOSIS — N529 Male erectile dysfunction, unspecified: Secondary | ICD-10-CM

## 2021-09-06 DIAGNOSIS — Z789 Other specified health status: Secondary | ICD-10-CM

## 2021-09-06 DIAGNOSIS — Z0000 Encounter for general adult medical examination without abnormal findings: Secondary | ICD-10-CM

## 2021-09-06 DIAGNOSIS — Z125 Encounter for screening for malignant neoplasm of prostate: Secondary | ICD-10-CM

## 2021-09-06 DIAGNOSIS — M1000 Idiopathic gout, unspecified site: Secondary | ICD-10-CM | POA: Diagnosis not present

## 2021-09-06 MED ORDER — ALLOPURINOL 100 MG PO TABS: 100.0000 mg | ORAL_TABLET | Freq: Every day | ORAL | 6 refills | Status: DC

## 2021-09-06 MED ORDER — COLCHICINE 0.6 MG PO TABS: 0.6000 mg | ORAL_TABLET | Freq: Every day | ORAL | 0 refills | Status: DC

## 2021-09-06 MED ORDER — TADALAFIL 5 MG PO TABS: 5.0000 mg | ORAL_TABLET | Freq: Every day | ORAL | 0 refills | Status: DC

## 2021-09-07 LAB — COMPREHENSIVE METABOLIC PANEL
ALT: 19 IU/L (ref 0–44)
AST: 25 IU/L (ref 0–40)
Albumin/Globulin Ratio: 1.7 (ref 1.2–2.2)
Albumin: 4.4 g/dL (ref 3.8–4.8)
Alkaline Phosphatase: 105 IU/L (ref 44–121)
BUN/Creatinine Ratio: 16 (ref 10–24)
BUN: 13 mg/dL (ref 8–27)
Bilirubin Total: 0.4 mg/dL (ref 0.0–1.2)
CO2: 23 mmol/L (ref 20–29)
Calcium: 9.5 mg/dL (ref 8.6–10.2)
Chloride: 106 mmol/L (ref 96–106)
Creatinine, Ser: 0.82 mg/dL (ref 0.76–1.27)
Globulin, Total: 2.6 g/dL (ref 1.5–4.5)
Glucose: 96 mg/dL (ref 70–99)
Potassium: 5.1 mmol/L (ref 3.5–5.2)
Sodium: 145 mmol/L — ABNORMAL HIGH (ref 134–144)
Total Protein: 7 g/dL (ref 6.0–8.5)
eGFR: 98 mL/min/{1.73_m2} (ref >59)

## 2021-09-07 LAB — CBC
Hematocrit: 44.9 % (ref 37.5–51.0)
Hemoglobin: 14.8 g/dL (ref 13.0–17.7)
MCH: 30.5 pg (ref 26.6–33.0)
MCHC: 33 g/dL (ref 31.5–35.7)
MCV: 93 fL (ref 79–97)
Platelets: 271 10*3/uL (ref 150–450)
RBC: 4.85 x10E6/uL (ref 4.14–5.80)
RDW: 13.3 % (ref 11.6–15.4)
WBC: 7.1 10*3/uL (ref 3.4–10.8)

## 2021-09-07 LAB — HEMOGLOBIN A1C
Est. average glucose Bld gHb Est-mCnc: 108 mg/dL
Hgb A1c MFr Bld: 5.4 % (ref 4.8–5.6)

## 2021-09-07 LAB — HEPATITIS C ANTIBODY: Hep C Virus Ab: NONREACTIVE

## 2021-09-07 LAB — LIPID PANEL
Chol/HDL Ratio: 4.5 ratio (ref 0.0–5.0)
Cholesterol, Total: 188 mg/dL (ref 100–199)
HDL: 42 mg/dL (ref >39)
LDL Chol Calc (NIH): 126 mg/dL — ABNORMAL HIGH (ref 0–99)
Triglycerides: 113 mg/dL (ref 0–149)
VLDL Cholesterol Cal: 20 mg/dL (ref 5–40)

## 2021-09-07 LAB — PSA: Prostate Specific Ag, Serum: 1.1 ng/mL (ref 0.0–4.0)

## 2021-09-07 LAB — TSH: TSH: 2.65 u[IU]/mL (ref 0.450–4.500)

## 2021-09-08 DIAGNOSIS — Z0000 Encounter for general adult medical examination without abnormal findings: Secondary | ICD-10-CM | POA: Insufficient documentation

## 2021-09-08 DIAGNOSIS — Z789 Other specified health status: Secondary | ICD-10-CM | POA: Insufficient documentation

## 2021-09-08 DIAGNOSIS — Z720 Tobacco use: Secondary | ICD-10-CM | POA: Insufficient documentation

## 2021-10-04 ENCOUNTER — Other Ambulatory Visit: Payer: Self-pay | Admitting: Physician Assistant

## 2021-10-04 DIAGNOSIS — N529 Male erectile dysfunction, unspecified: Secondary | ICD-10-CM

## 2021-10-11 ENCOUNTER — Ambulatory Visit (INDEPENDENT_AMBULATORY_CARE_PROVIDER_SITE_OTHER): Payer: No Typology Code available for payment source | Admitting: Physician Assistant

## 2021-10-11 ENCOUNTER — Encounter: Payer: Self-pay | Admitting: Physician Assistant

## 2021-10-11 VITALS — BP 129/79 | HR 75 | Ht 72.0 in | Wt 210.4 lb

## 2021-10-11 DIAGNOSIS — N529 Male erectile dysfunction, unspecified: Secondary | ICD-10-CM | POA: Diagnosis not present

## 2021-10-11 DIAGNOSIS — Z1211 Encounter for screening for malignant neoplasm of colon: Secondary | ICD-10-CM

## 2021-10-11 DIAGNOSIS — Z720 Tobacco use: Secondary | ICD-10-CM

## 2021-10-11 DIAGNOSIS — M1000 Idiopathic gout, unspecified site: Secondary | ICD-10-CM | POA: Diagnosis not present

## 2021-10-11 DIAGNOSIS — Z789 Other specified health status: Secondary | ICD-10-CM

## 2021-10-11 MED ORDER — TADALAFIL 5 MG PO TABS: 5.0000 mg | ORAL_TABLET | Freq: Every day | ORAL | 2 refills | Status: DC

## 2021-10-13 DIAGNOSIS — Z1211 Encounter for screening for malignant neoplasm of colon: Secondary | ICD-10-CM | POA: Insufficient documentation

## 2021-11-22 ENCOUNTER — Telehealth: Payer: Self-pay

## 2021-11-22 NOTE — Telephone Encounter (Signed)
Copied from CRM [id number]. Topic: General - Other >> Nov 22, 2021  3:12 PM Ja-Kwan M wrote: Reason for CRM: Pt spouse reports pt is having a gout flare up and requests a Rx for either prednisone or colchicine to be sent to [id number], [place] - [address] Phone: [phone] Fax: [phone]

## 2021-11-25 NOTE — Telephone Encounter (Signed)
Spoke with patients wife and she verbalized understanding and will speak with her husband and call if he wants to make appointment

## 2022-01-10 ENCOUNTER — Encounter: Payer: Self-pay | Admitting: Physician Assistant

## 2022-01-10 ENCOUNTER — Ambulatory Visit (INDEPENDENT_AMBULATORY_CARE_PROVIDER_SITE_OTHER): Payer: No Typology Code available for payment source | Admitting: Physician Assistant

## 2022-01-10 VITALS — BP 136/7 | HR 68 | Resp 16 | Wt 213.0 lb

## 2022-01-10 DIAGNOSIS — E785 Hyperlipidemia, unspecified: Secondary | ICD-10-CM | POA: Diagnosis not present

## 2022-01-10 DIAGNOSIS — M1000 Idiopathic gout, unspecified site: Secondary | ICD-10-CM | POA: Diagnosis not present

## 2022-01-10 DIAGNOSIS — Z789 Other specified health status: Secondary | ICD-10-CM

## 2022-01-10 DIAGNOSIS — Z720 Tobacco use: Secondary | ICD-10-CM | POA: Diagnosis not present

## 2022-01-10 DIAGNOSIS — E7800 Pure hypercholesterolemia, unspecified: Secondary | ICD-10-CM

## 2022-01-10 DIAGNOSIS — Z1211 Encounter for screening for malignant neoplasm of colon: Secondary | ICD-10-CM

## 2022-01-10 MED ORDER — ATORVASTATIN CALCIUM 10 MG PO TABS: 10.0000 mg | ORAL_TABLET | Freq: Every day | ORAL | 3 refills | Status: DC

## 2022-01-10 NOTE — Progress Notes (Signed)
Established patient visit   Patient: Evan Valencia   DOB: March 18, 1956   66 y.o. Male  MRN: [id number] Visit Date: 01/10/2022  Today's healthcare provider: Mardene Speak, PA-C   Chief Complaint  Patient presents with   Follow-up   Hyperlipidemia   Gout   Subjective    HPI  Lipid/Cholesterol, Follow-up  Last lipid panel Other pertinent labs  Lab Results  Component Value Date   CHOL 188 09/06/2021   HDL 42 09/06/2021   LDLCALC 126 (H) 09/06/2021   TRIG 113 09/06/2021   CHOLHDL 4.5 09/06/2021   Lab Results  Component Value Date   ALT 19 09/06/2021   AST 25 09/06/2021   PLT 271 09/06/2021   TSH 2.650 09/06/2021     He was last seen for this 3 months ago.  Management since that visit includes; given free samples were provided: Livolo 2 mg - 1/2 tab daily for the next 28 days.   The 10-year ASCVD risk score (Arnett DK, et al., 2019) is: 14.9%  Patient did try samples and did well with the medication. --------------------------------------------------------------------------------------------------- Follow up for Gout:  The patient was last seen for this 3 months ago. Changes made at last visit include; taking allopurinol 100 mg.  Continue to chew tobacco daily Continue to drink beer -----------------------------------------------------------------------------------------   Medications: Outpatient Medications Prior to Visit  Medication Sig   allopurinol (ZYLOPRIM) 100 MG tablet Take 1 tablet (100 mg total) by mouth daily.   colchicine 0.6 MG tablet Take 1 tablet (0.6 mg total) by mouth daily.   tadalafil (CIALIS) 5 MG tablet Take 1 tablet (5 mg total) by mouth daily.   No facility-administered medications prior to visit.    Review of Systems  All other systems reviewed and are negative.  Except see HPI    Objective    BP (!) 136/7 (BP Location: Right Arm, Patient Position: Sitting, Cuff Size: Large)   Pulse 68   Resp 16   Wt 213 lb (96.6 kg)    SpO2 98%   BMI 28.89 kg/m    Physical Exam Vitals reviewed.  Constitutional:      General: He is not in acute distress.    Appearance: Normal appearance. He is not diaphoretic.  HENT:     Head: Normocephalic and atraumatic.  Eyes:     General: No scleral icterus.    Conjunctiva/sclera: Conjunctivae normal.  Cardiovascular:     Rate and Rhythm: Normal rate and regular rhythm.     Pulses: Normal pulses.     Heart sounds: Normal heart sounds. No murmur heard. Pulmonary:     Effort: Pulmonary effort is normal. No respiratory distress.     Breath sounds: Normal breath sounds. No wheezing or rhonchi.  Musculoskeletal:     Cervical back: Neck supple.     Right lower leg: No edema.     Left lower leg: No edema.  Lymphadenopathy:     Cervical: No cervical adenopathy.  Skin:    General: Skin is warm and dry.     Findings: No rash.  Neurological:     Mental Status: He is alert and oriented to person, place, and time. Mental status is at baseline.  Psychiatric:        Mood and Affect: Mood normal.        Behavior: Behavior normal.        Thought Content: Thought content normal.        Judgment: Judgment normal.  No results found for any visits on 01/10/22.  Assessment & Plan     1. Tobacco chew use Strongly recommended tobacco cessation. Patient is reluctant. Risks of tobacco chew use was explained.  Will reinforce at the next appt  2. Alcohol use Strongly encouraged on alcohol cessation  Patient is reluctant. He "loves his beer."  3. Idiopathic gout, unspecified chronicity, unspecified site Asymptomatic. Last uric acid was 7/2 on 09/05/21 Continue with chronic medication and repeat: - Uric acid - Renal Function Panel  4.  Elevated LDL cholesterol level Livalo will not be covered by his insurance. Let him try Lipitor - atorvastatin (LIPITOR) 10 MG tablet; Take 1 tablet (10 mg total) by mouth daily.  Dispense: 90 tablet; Refill: 3 - Lipid panel Pt was  recommended the following lifestyle modifications: The Mediterranean-style eating plan, alcohol cessation, weight management, regular exercises  5. Screening for colon cancer Will check why pt did not receive his kit after first order was placed - Cologuard   FU in 3 mo for chronic conditions  The patient was advised to call back or seek an in-person evaluation if the symptoms worsen or if the condition fails to improve as anticipated.  I discussed the assessment and treatment plan with the patient. The patient was provided an opportunity to ask questions and all were answered. The patient agreed with the plan and demonstrated an understanding of the instructions.  The entirety of the information documented in the History of Present Illness, Review of Systems and Physical Exam were personally obtained by me. Portions of this information were initially documented by the CMA and reviewed by me for thoroughness and accuracy.  Portions of this note were created using dictation software and may contain typographical errors.   Mardene Speak, PA-C  [facility] [phone] (phone) [phone] (fax)  [place]

## 2022-01-11 LAB — RENAL FUNCTION PANEL
Albumin: 4.5 g/dL (ref 3.9–4.9)
BUN/Creatinine Ratio: 16 (ref 10–24)
BUN: 15 mg/dL (ref 8–27)
CO2: 23 mmol/L (ref 20–29)
Calcium: 9.3 mg/dL (ref 8.6–10.2)
Chloride: 102 mmol/L (ref 96–106)
Creatinine, Ser: 0.91 mg/dL (ref 0.76–1.27)
Glucose: 86 mg/dL (ref 70–99)
Phosphorus: 3 mg/dL (ref 2.8–4.1)
Potassium: 4.2 mmol/L (ref 3.5–5.2)
Sodium: 142 mmol/L (ref 134–144)
eGFR: 94 mL/min/{1.73_m2} (ref >59)

## 2022-01-11 LAB — LIPID PANEL
Chol/HDL Ratio: 3.6 ratio (ref 0.0–5.0)
Cholesterol, Total: 172 mg/dL (ref 100–199)
HDL: 48 mg/dL (ref >39)
LDL Chol Calc (NIH): 97 mg/dL (ref 0–99)
Triglycerides: 154 mg/dL — ABNORMAL HIGH (ref 0–149)
VLDL Cholesterol Cal: 27 mg/dL (ref 5–40)

## 2022-01-11 LAB — URIC ACID: Uric Acid: 7 mg/dL (ref 3.8–8.4)

## 2022-01-13 NOTE — Progress Notes (Signed)
Hello Evan Valencia ,   Your labwork results all are within normal limits Except some elevated TGL but you did not fast. Your uric acid was 7. Goal is less than 6. Please, increase the dose of allopurinol to 200 mg/2 tablets.  Recommended to  Avoid:  Organ meats, red meat, shellfish High-fructose corn syrup-sweetened sodas,  Alcohol overuse (>2 servings per day for men) Encourage:  Coffee intake of >4 cups per day Foods rich in dietary fiber, folate, vitamin C Higher intake of dairy products  Cherry consumption may be of benefit in gout prophylaxis     Any questions please reach out to the office or message me on MyChart!  Best, Mardene Speak, PA-C

## 2022-02-05 ENCOUNTER — Other Ambulatory Visit: Payer: Self-pay | Admitting: Physician Assistant

## 2022-02-05 DIAGNOSIS — N529 Male erectile dysfunction, unspecified: Secondary | ICD-10-CM

## 2022-02-05 NOTE — Telephone Encounter (Signed)
Requested Prescriptions  Pending Prescriptions Disp Refills  . tadalafil (CIALIS) 5 MG tablet [Pharmacy Med Name: TADALAFIL 5 MG TABLET] 30 tablet 2    Sig: TAKE 1 TABLET (5 MG TOTAL) BY MOUTH DAILY.     Urology: Erectile Dysfunction Agents Failed - 02/05/2022  1:54 AM      Failed - Last BP in normal range    BP Readings from Last 1 Encounters:  01/10/22 (!) 136/7         Passed - AST in normal range and within 360 days    AST  Date Value Ref Range Status  09/06/2021 25 0 - 40 IU/L Final         Passed - ALT in normal range and within 360 days    ALT  Date Value Ref Range Status  09/06/2021 19 0 - 44 IU/L Final         Passed - Valid encounter within last 12 months    Recent Outpatient Visits          3 weeks ago Tobacco chew use   [facility] [place], [place], PA-C   3 months ago Screening for colon cancer   [facility], [place], PA-C   5 months ago Sales executive for annual health examination   [employer], [place], PA-C   5 months ago Erectile dysfunction, unspecified erectile dysfunction type   [employer], [place], PA-C      Future Appointments            In 2 months Ostwalt, Letitia Libra, PA-C [employer], [place]

## 2022-03-26 ENCOUNTER — Other Ambulatory Visit: Payer: Self-pay | Admitting: Physician Assistant

## 2022-03-26 DIAGNOSIS — M1000 Idiopathic gout, unspecified site: Secondary | ICD-10-CM

## 2022-04-08 ENCOUNTER — Other Ambulatory Visit: Payer: Self-pay | Admitting: Physician Assistant

## 2022-04-08 DIAGNOSIS — M1000 Idiopathic gout, unspecified site: Secondary | ICD-10-CM

## 2022-04-08 NOTE — Telephone Encounter (Signed)
Rx 03/26/22 #90- too soon Requested Prescriptions  Pending Prescriptions Disp Refills   allopurinol (ZYLOPRIM) 100 MG tablet [Pharmacy Med Name: ALLOPURINOL 100 MG TABLET] 90 tablet 0    Sig: TAKE 1 TABLET BY MOUTH EVERY DAY     Endocrinology:  Gout Agents - allopurinol Failed - 04/08/2022  8:09 AM      Failed - CBC within normal limits and completed in the last 12 months    WBC  Date Value Ref Range Status  09/06/2021 7.1 3.4 - 10.8 x10E3/uL Final   RBC  Date Value Ref Range Status  09/06/2021 4.85 4.14 - 5.80 x10E6/uL Final   Hemoglobin  Date Value Ref Range Status  09/06/2021 14.8 13.0 - 17.7 g/dL Final   Hematocrit  Date Value Ref Range Status  09/06/2021 44.9 37.5 - 51.0 % Final   MCHC  Date Value Ref Range Status  09/06/2021 33.0 31.5 - 35.7 g/dL Final   [facility]  Date Value Ref Range Status  09/06/2021 30.5 26.6 - 33.0 pg Final   MCV  Date Value Ref Range Status  09/06/2021 93 79 - 97 fL Final   No results found for: "PLTCOUNTKUC", "LABPLAT", "POCPLA" RDW  Date Value Ref Range Status  09/06/2021 13.3 11.6 - 15.4 % Final         Passed - Uric Acid in normal range and within 360 days    Uric Acid  Date Value Ref Range Status  01/10/2022 7.0 3.8 - 8.4 mg/dL Final    Comment:               Therapeutic target for gout patients: <6.0         Passed - Cr in normal range and within 360 days    Creatinine, Ser  Date Value Ref Range Status  01/10/2022 0.91 0.76 - 1.27 mg/dL Final         Passed - Valid encounter within last 12 months    Recent Outpatient Visits           2 months ago Tobacco chew use   [facility] [place], [place], PA-C   5 months ago Screening for colon cancer   [employer], [place], PA-C   7 months ago Audiological scientist for annual health examination   [employer], [place], PA-C   7 months ago Erectile dysfunction, unspecified erectile dysfunction type   [employer],  [place], PA-C       Future Appointments             In 3 days [name], PA-C [employer], [facility]

## 2022-04-11 ENCOUNTER — Ambulatory Visit: Payer: No Typology Code available for payment source | Admitting: Family Medicine

## 2022-04-11 ENCOUNTER — Telehealth: Payer: Self-pay

## 2022-04-11 NOTE — Telephone Encounter (Signed)
Copied from CRM [id number]. Topic: Appointment Scheduling - Scheduling Inquiry for Clinic >> Apr 11, 2022  3:06 PM [place] B wrote: Reason for CRM: Patient's wife called in just wanted to left office, they were upset that she says she was told appt was at 3 and they had it written  down and everything, then was told the apt is at 3 instead. She says she just doesn't want this to happen again

## 2022-04-11 NOTE — Telephone Encounter (Signed)
FYI

## 2022-04-11 NOTE — Progress Notes (Deleted)
      Established patient visit   Patient: Evan Valencia   DOB: 13-Jun-1955   66 y.o. Male  MRN: [id number] Visit Date: 04/11/2022  Today's healthcare provider: Mila Merry, MD   No chief complaint on file.  Subjective    HPI  Lipid/Cholesterol, Follow-up  Last lipid panel Other pertinent labs  Lab Results  Component Value Date   CHOL 172 01/10/2022   HDL 48 01/10/2022   LDLCALC 97 01/10/2022   TRIG 154 (H) 01/10/2022   CHOLHDL 3.6 01/10/2022   Lab Results  Component Value Date   ALT 19 09/06/2021   AST 25 09/06/2021   PLT 271 09/06/2021   TSH 2.650 09/06/2021     He was last seen for this 3 months ago.  Management since that visit includes; labs checked showing- within normal limits. --------------------------------------------------------------------------------------------------- Follow up for Idiopathic gout:  The patient was last seen for this 3 months ago. Changes made at last visit include; uric acid was 7. Increased the dose of allopurinol to 200 mg bid.  -----------------------------------------------------------------------------------------   Medications: Outpatient Medications Prior to Visit  Medication Sig   allopurinol (ZYLOPRIM) 100 MG tablet TAKE 1 TABLET BY MOUTH EVERY DAY   atorvastatin (LIPITOR) 10 MG tablet Take 1 tablet (10 mg total) by mouth daily.   colchicine 0.6 MG tablet Take 1 tablet (0.6 mg total) by mouth daily.   tadalafil (CIALIS) 5 MG tablet TAKE 1 TABLET (5 MG TOTAL) BY MOUTH DAILY.   No facility-administered medications prior to visit.    Review of Systems  Constitutional:  Negative for appetite change, chills and fever.  Respiratory:  Negative for chest tightness, shortness of breath and wheezing.   Cardiovascular:  Negative for chest pain and palpitations.  Gastrointestinal:  Negative for abdominal pain, nausea and vomiting.    {Labs  Heme  Chem  Endocrine  Serology  Results Review (optional):23779}    Objective    There were no vitals taken for this visit. {Show previous vital signs (optional):23777}  Physical Exam  ***  No results found for any visits on 04/11/22.  Assessment & Plan     ***  No follow-ups on file.      {provider attestation***:1}   Mila Merry, MD  [facility] [phone] (phone) [phone] (fax)  [facility]

## 2022-05-06 ENCOUNTER — Other Ambulatory Visit: Payer: Self-pay | Admitting: Physician Assistant

## 2022-05-06 DIAGNOSIS — N529 Male erectile dysfunction, unspecified: Secondary | ICD-10-CM

## 2022-05-06 NOTE — Telephone Encounter (Signed)
Requested Prescriptions  Pending Prescriptions Disp Refills   tadalafil (CIALIS) 5 MG tablet [Pharmacy Med Name: TADALAFIL 5 MG TABLET] 90 tablet 0    Sig: TAKE 1 TABLET (5 MG TOTAL) BY MOUTH DAILY.     Urology: Erectile Dysfunction Agents Failed - 05/06/2022  1:32 AM      Failed - Last BP in normal range    BP Readings from Last 1 Encounters:  01/10/22 (!) 136/7         Passed - AST in normal range and within 360 days    AST  Date Value Ref Range Status  09/06/2021 25 0 - 40 IU/L Final         Passed - ALT in normal range and within 360 days    ALT  Date Value Ref Range Status  09/06/2021 19 0 - 44 IU/L Final         Passed - Valid encounter within last 12 months    Recent Outpatient Visits           3 months ago Tobacco chew use   [facility] [place], [place], PA-C   6 months ago Screening for colon cancer   [employer], [place], PA-C   8 months ago Sales executive for annual health examination   [employer], [place], PA-C   8 months ago Erectile dysfunction, unspecified erectile dysfunction type   [employer], [place], PA-C       Future Appointments             In 1 week Fisher, Kirstie Peri, MD [facility], [facility]

## 2022-05-16 ENCOUNTER — Ambulatory Visit (INDEPENDENT_AMBULATORY_CARE_PROVIDER_SITE_OTHER): Payer: No Typology Code available for payment source | Admitting: Family Medicine

## 2022-05-16 ENCOUNTER — Encounter: Payer: Self-pay | Admitting: Family Medicine

## 2022-05-16 VITALS — BP 151/80 | HR 90 | Temp 98.7°F | Wt 233.7 lb

## 2022-05-16 DIAGNOSIS — N529 Male erectile dysfunction, unspecified: Secondary | ICD-10-CM

## 2022-05-16 DIAGNOSIS — Z23 Encounter for immunization: Secondary | ICD-10-CM

## 2022-05-16 DIAGNOSIS — E790 Hyperuricemia without signs of inflammatory arthritis and tophaceous disease: Secondary | ICD-10-CM | POA: Diagnosis not present

## 2022-05-16 MED ORDER — SILDENAFIL CITRATE 50 MG PO TABS: 50.0000 mg | ORAL_TABLET | Freq: Every day | ORAL | 1 refills | Status: DC | PRN

## 2022-05-16 NOTE — Progress Notes (Unsigned)
     I,Connie R Striblin,acting as a Education administrator for Lelon Huh, MD.,have documented all relevant documentation on the behalf of Lelon Huh, MD,as directed by  Lelon Huh, MD while in the presence of Lelon Huh, MD.   Established patient visit   Patient: Evan Valencia   DOB: [birth year]   67 y.o. Male  MRN: [id number] Visit Date: 05/16/2022  Today's healthcare provider: Lelon Huh, MD    Subjective    HPI  Presents today to follow up on hyperuricemia since dose of allopurinol was increased at his last visit.  Lab Results  Component Value Date   LABURIC 7.0 01/10/2022    He states he has had no gout flares and has had not had to take any colchicine since increase dose of allopurinol.   He also states that daily Cialis has not been effective and would like to try Viagra instead.  ---------------------------------------------------------------------------------------------------   Medications: Outpatient Medications Prior to Visit  Medication Sig   allopurinol (ZYLOPRIM) 100 MG tablet TAKE 1 TABLET BY MOUTH EVERY DAY   atorvastatin (LIPITOR) 10 MG tablet Take 1 tablet (10 mg total) by mouth daily.   colchicine 0.6 MG tablet Take 1 tablet (0.6 mg total) by mouth daily.   tadalafil (CIALIS) 5 MG tablet TAKE 1 TABLET (5 MG TOTAL) BY MOUTH DAILY.   No facility-administered medications prior to visit.    Review of Systems  Constitutional:  Negative for appetite change, chills and fever.  Respiratory:  Negative for chest tightness, shortness of breath and wheezing.   Cardiovascular:  Negative for chest pain and palpitations.  Gastrointestinal:  Negative for abdominal pain, nausea and vomiting.    {Labs  Heme  Chem  Endocrine  Serology  Results Review (optional):23779}   Objective    BP (!) 151/80 (BP Location: Left Arm, Patient Position: Sitting, Cuff Size: Large)   Pulse 90   Temp 98.7 F (37.1 C) (Oral)   Wt 233 lb 11.2 oz (106 kg)   SpO2 99%   BMI  31.70 kg/m  {Show previous vital signs (optional):23777}  Physical Exam   General appearance: {[phone]::"Obese"} male, cooperative and in no acute distress Head: Normocephalic, without obvious abnormality, atraumatic Respiratory: Respirations even and unlabored, normal respiratory rate Extremities: All extremities are intact.  Skin: Skin color, texture, turgor normal. No rashes seen  Psych: Appropriate mood and affect. Neurologic: Mental status: Alert, oriented to person, place, and time, thought content appropriate.    Assessment & Plan     1. Hyperuricemia Doing well with increased dose of allopurinol with no gout flares.   - Uric acid  2. Erectile dysfunction, unspecified erectile dysfunction type Cialis has not been effective, will change to sildenafil (VIAGRA) 50 MG tablet; Take 1 tablet (50 mg total) by mouth daily as needed for erectile dysfunction.  Dispense: 30 tablet; Refill: 1   3. Need for shingles vaccine  - Zoster Recombinant (Shingrix ) #2      The entirety of the information documented in the History of Present Illness, Review of Systems and Physical Exam were personally obtained by me. Portions of this information were initially documented by the CMA and reviewed by me for thoroughness and accuracy.     Lelon Huh, MD  [place] [phone] (phone) [phone] (fax)  [place]

## 2022-05-17 LAB — URIC ACID: Uric Acid: 6 mg/dL (ref 3.8–8.4)

## 2022-06-10 ENCOUNTER — Telehealth: Payer: Self-pay | Admitting: *Deleted

## 2022-06-10 NOTE — Telephone Encounter (Signed)
Result note read to pt, verbalizes understanding.    "Your uric acid levels are much better at 6.0. Sometimes we need to get under 5 to get gout under control. Let me know if you have any more gout flares in which case we could increase the allopurinol some more."   Dr. Caryn Section

## 2022-06-12 ENCOUNTER — Encounter: Payer: Self-pay | Admitting: Family Medicine

## 2022-06-17 ENCOUNTER — Other Ambulatory Visit: Payer: Self-pay | Admitting: Physician Assistant

## 2022-06-17 DIAGNOSIS — M1000 Idiopathic gout, unspecified site: Secondary | ICD-10-CM

## 2022-06-17 MED ORDER — ALLOPURINOL 100 MG PO TABS: 100.0000 mg | ORAL_TABLET | Freq: Every day | ORAL | 0 refills | Status: DC

## 2022-06-17 NOTE — Telephone Encounter (Signed)
Medication Refill - Medication: allopurinol (ZYLOPRIM) 100 MG tablet   Has the patient contacted their pharmacy? No. (Agent: If no, request that the patient contact the pharmacy for the refill. If patient does not wish to contact the pharmacy document the reason why and proceed with request.) (Agent: If yes, when and what did the pharmacy advise?)  Preferred Pharmacy (with phone number or street name):  [id number] [address]: [phone] Fax: [phone]    Has the patient been seen for an appointment in the last year OR does the patient have an upcoming appointment? Yes.    Agent: Please be advised that RX refills may take up to 3 business days. We ask that you follow-up with your pharmacy.

## 2022-06-17 NOTE — Telephone Encounter (Signed)
Requested Prescriptions  Pending Prescriptions Disp Refills   allopurinol (ZYLOPRIM) 100 MG tablet 90 tablet 0    Sig: Take 1 tablet (100 mg total) by mouth daily.     Endocrinology:  Gout Agents - allopurinol Failed - 06/17/2022  9:15 AM      Failed - CBC within normal limits and completed in the last 12 months    WBC  Date Value Ref Range Status  09/06/2021 7.1 3.4 - 10.8 x10E3/uL Final   RBC  Date Value Ref Range Status  09/06/2021 4.85 4.14 - 5.80 x10E6/uL Final   Hemoglobin  Date Value Ref Range Status  09/06/2021 14.8 13.0 - 17.7 g/dL Final   Hematocrit  Date Value Ref Range Status  09/06/2021 44.9 37.5 - 51.0 % Final   MCHC  Date Value Ref Range Status  09/06/2021 33.0 31.5 - 35.7 g/dL Final   [facility]  Date Value Ref Range Status  09/06/2021 30.5 26.6 - 33.0 pg Final   MCV  Date Value Ref Range Status  09/06/2021 93 79 - 97 fL Final   No results found for: "PLTCOUNTKUC", "LABPLAT", "POCPLA" RDW  Date Value Ref Range Status  09/06/2021 13.3 11.6 - 15.4 % Final         Passed - Uric Acid in normal range and within 360 days    Uric Acid  Date Value Ref Range Status  05/16/2022 6.0 3.8 - 8.4 mg/dL Final    Comment:               Therapeutic target for gout patients: <6.0         Passed - Cr in normal range and within 360 days    Creatinine, Ser  Date Value Ref Range Status  01/10/2022 0.91 0.76 - 1.27 mg/dL Final         Passed - Valid encounter within last 12 months    Recent Outpatient Visits           1 month ago Hyperuricemia   [place], Donald E, MD   5 months ago Tobacco chew use   [place], [place], PA-C   8 months ago Screening for colon cancer   [place], [place], PA-C   9 months ago Encounter for annual health examination   [place], [place], PA-C   9 months ago Erectile dysfunction, unspecified  erectile dysfunction type   [facility] [place], [place], PA-C       Future Appointments             In 3 days Mardene Speak, PA-C [place], [facility]

## 2022-06-20 ENCOUNTER — Ambulatory Visit: Payer: No Typology Code available for payment source | Admitting: Physician Assistant

## 2022-06-20 ENCOUNTER — Telehealth: Payer: Self-pay | Admitting: Physician Assistant

## 2022-06-27 ENCOUNTER — Ambulatory Visit: Payer: No Typology Code available for payment source | Admitting: Physician Assistant

## 2022-08-05 ENCOUNTER — Other Ambulatory Visit: Payer: Self-pay | Admitting: Physician Assistant

## 2022-08-05 DIAGNOSIS — N529 Male erectile dysfunction, unspecified: Secondary | ICD-10-CM

## 2022-08-06 MED ORDER — TADALAFIL 5 MG PO TABS: 5.0000 mg | ORAL_TABLET | Freq: Every day | ORAL | 0 refills | Status: DC

## 2022-08-08 ENCOUNTER — Other Ambulatory Visit: Payer: Self-pay | Admitting: Physician Assistant

## 2022-08-08 DIAGNOSIS — N529 Male erectile dysfunction, unspecified: Secondary | ICD-10-CM

## 2022-08-08 NOTE — Telephone Encounter (Signed)
Refilled 08/06/22 #90. Requested Prescriptions  Refused Prescriptions Disp Refills   tadalafil (CIALIS) 5 MG tablet [Pharmacy Med Name: TADALAFIL 5 MG TABLET] 90 tablet 0    Sig: TAKE 1 TABLET (5 MG TOTAL) BY MOUTH DAILY.     Urology: Erectile Dysfunction Agents Failed - 08/08/2022  1:46 AM      Failed - Last BP in normal range    BP Readings from Last 1 Encounters:  05/16/22 (!) 151/80         Passed - AST in normal range and within 360 days    AST  Date Value Ref Range Status  09/06/2021 25 0 - 40 IU/L Final         Passed - ALT in normal range and within 360 days    ALT  Date Value Ref Range Status  09/06/2021 19 0 - 44 IU/L Final         Passed - Valid encounter within last 12 months    Recent Outpatient Visits           2 months ago Hyperuricemia   [place] [facility] Malva Limes, MD   7 months ago Tobacco chew use   [place] [facility] [place], [place], PA-C   10 months ago Screening for colon cancer   [place] [facility] [place], [place], PA-C   11 months ago Encounter for annual health examination   [facility] [place], [place], PA-C   11 months ago Erectile dysfunction, unspecified erectile dysfunction type   [facility] [place], [place], [place]

## 2022-08-25 ENCOUNTER — Encounter: Payer: Self-pay | Admitting: Physician Assistant

## 2022-08-26 ENCOUNTER — Encounter: Payer: Self-pay | Admitting: Physician Assistant

## 2022-08-26 ENCOUNTER — Ambulatory Visit (INDEPENDENT_AMBULATORY_CARE_PROVIDER_SITE_OTHER): Payer: No Typology Code available for payment source | Admitting: Physician Assistant

## 2022-08-26 VITALS — BP 140/86 | HR 79 | Ht 72.0 in | Wt 217.8 lb

## 2022-08-26 DIAGNOSIS — M1049 Other secondary gout, multiple sites: Secondary | ICD-10-CM

## 2022-08-26 DIAGNOSIS — M545 Low back pain, unspecified: Secondary | ICD-10-CM

## 2022-08-26 DIAGNOSIS — M109 Gout, unspecified: Secondary | ICD-10-CM

## 2022-08-26 DIAGNOSIS — M1000 Idiopathic gout, unspecified site: Secondary | ICD-10-CM

## 2022-08-26 MED ORDER — ALLOPURINOL 100 MG PO TABS
100.0000 mg | ORAL_TABLET | Freq: Every day | ORAL | 0 refills | Status: DC
Start: 2022-08-26 — End: 2022-11-24

## 2022-08-26 MED ORDER — COLCHICINE 0.6 MG PO TABS: 0.6000 mg | ORAL_TABLET | Freq: Every day | ORAL | 0 refills | Status: DC

## 2022-08-26 MED ORDER — PREDNISONE 20 MG PO TABS
20.0000 mg | ORAL_TABLET | Freq: Every day | ORAL | 0 refills | Status: AC
Start: 2022-08-26 — End: ?

## 2022-08-26 NOTE — Progress Notes (Unsigned)
I,Sha'taria Tyson,acting as a Neurosurgeon for [employer], PA-C.,have documented all relevant documentation on the behalf of [name], PA-C,as directed by  [employer], PA-C while in the presence of [employer], PA-C.   Established patient visit   Patient: Evan Valencia   DOB: [birth year]   67 y.o. Male  MRN: [id number] Visit Date: 08/26/2022  Today's healthcare provider: [name], PA-C   No chief complaint on file.  Subjective    HPI  Follow up for Idiopathic gout, unspecified chronicity, unspecified site   The patient was last seen for this 7 months ago. Changes made at last visit include continue with chronic medication and repeat .  He reports excellent compliance with treatment. Patent reports flare up started last Thursday.  Patient reports flare up in both knees, right arm and fingers and his back.  -----------------------------------------------------------------------------------------   Medications: Outpatient Medications Prior to Visit  Medication Sig   allopurinol (ZYLOPRIM) 100 MG tablet Take 1 tablet (100 mg total) by mouth daily.   atorvastatin (LIPITOR) 10 MG tablet Take 1 tablet (10 mg total) by mouth daily.   colchicine 0.6 MG tablet Take 1 tablet (0.6 mg total) by mouth daily.   sildenafil (VIAGRA) 50 MG tablet Take 1 tablet (50 mg total) by mouth daily as needed for erectile dysfunction.   tadalafil (CIALIS) 5 MG tablet Take 1 tablet (5 mg total) by mouth daily.   No facility-administered medications prior to visit.    Review of Systems  Constitutional:  Positive for appetite change.  Musculoskeletal:  Positive for arthralgias, back pain, gait problem and joint swelling.    {Labs  Heme  Chem  Endocrine  Serology  Results Review (optional):23779}   Objective    There were no vitals taken for this visit. BP Readings from Last 3 Encounters:  08/26/22 (!) 140/86  05/16/22 (!) 151/80  01/10/22 (!) 136/7   Wt Readings from Last 3  Encounters:  08/26/22 217 lb 12.8 oz (98.8 kg)  05/16/22 233 lb 11.2 oz (106 kg)  01/10/22 213 lb (96.6 kg)      Physical Exam Constitutional:      General: He is not in acute distress.    Appearance: Normal appearance. He is not diaphoretic.  HENT:     Head: Normocephalic.  Eyes:     Conjunctiva/sclera: Conjunctivae normal.  Pulmonary:     Effort: Pulmonary effort is normal. No respiratory distress.  Musculoskeletal:        General: Swelling (both knees, L > R), tenderness and deformity present.  Skin:    Findings: Erythema present.  Neurological:     Mental Status: He is alert and oriented to person, place, and time. Mental status is at baseline.     ***  No results found for any visits on 08/26/22.  Assessment & Plan     Treatment should be initiated within 12-24 hours of acute gout attack and continued for 1-2 days after attack has subsided (1).  Urate-lowering therapy should not be interrupted during an acute gout attack.  Mild/moderate gout severity (?6 of 10 on visual analog pain scale, particularly for an attack involving only one or a few small joints or one to two large joints)  NSAIDs:  Naproxen : 500 mg BID  Meloxicam: 7.5 to 15 mg/day  Indomethacin : 50 to 150 mg/day (generally less well tolerated than others)  Diclofenac: 75 mg twice daily  Ibuprofen: 800 mg every 8 hours  Oral/intra-articular/intramuscular/intravenous corticosteroids  Corticosteroids are  useful in patients with acute gout flare who cannot tolerate NSAIDs or have contraindications to NSAIDs and colchicine such as chronic kidney disease (CKD).  When monotherapy is insufficient for acute flares, a combination of NSAIDs with either intra-articular corticosteroid, oral steroid, or colchicine may be used.  Oral corticosteroids--Medium dose (15-20 mg/day) to high dose (30-35 mg/day) for 5 days  Intra-articular/intramuscular/intravenous preparation--triamcinolone acetonide,  dexamethasone, methylprednisolone  Colchicine: Used for gout attacks where the onset was <36 hours prior to treatment initiation  Begin a loading dose of 1-1.2 mg followed by 0.5-0.6 mg 1 hour later, after 12 hours, resume 0.5-0.6 mg every 8 hours with no more than 6mg  per course and at least three days between courses  Severe gout (?7 of 10 pain scale, involving ?4 joints with arthritis involving >1 region, or involving 3 separate large joints)  Initial combination therapy is an option and includes the use of full doses of the following:  Colchicine and NSAIDs  PO corticosteroids and colchicine  Intra-articular steroids  Chronic treatment includes using urate-lowering therapies for a serum uric acid <6 mg/dL (may need <5 mg/dL to improve symptoms).  Urate-lowering agents can be prescribed during an acute attack provided that effective anti-inflammatory prophylaxis has been initiated first such as:  Low-dose NSAIDs: naproxen 250 mg PO BID  Low-dose colchicine: 0.5-0.6 mg once or twice daily  If colchicine and NSAIDs, contraindicated, or ineffective:  Low-dose prednisone at ?10 mg/day  Treatment duration of at least 6 months or 3 months after achieving serum urate target.  Urate-lowering agents:  Allopurinol: xanthine oxidase inhibitor--does not reduce number of acute attacks.  Start no higher than 100 mg/day (50 mg/day in stage 4 or 5 CKD).  Titrate dose upward q2-5wk to maximum dose.  Monitor serum uric acid level and renal function 3 months in the first year and annually thereafter.  Monitor for allopurinol hypersensitivity syndrome (AHS), pruritus, rash, elevated hepatic transaminases, and eosinophilia. HLA-B*5801 allele for AHS should be performed in those of [place], [place] or [place].  No follow-ups on file.      {provider attestation***:1}   [name], PA-C  [facility] [phone] (phone) [phone] (fax)  [facility]

## 2022-08-27 LAB — BASIC METABOLIC PANEL
BUN/Creatinine Ratio: 11 (ref 10–24)
BUN: 10 mg/dL (ref 8–27)
CO2: 23 mmol/L (ref 20–29)
Calcium: 9.4 mg/dL (ref 8.6–10.2)
Chloride: 104 mmol/L (ref 96–106)
Creatinine, Ser: 0.87 mg/dL (ref 0.76–1.27)
Glucose: 86 mg/dL (ref 70–99)
Potassium: 4.7 mmol/L (ref 3.5–5.2)
Sodium: 142 mmol/L (ref 134–144)
eGFR: 96 mL/min/{1.73_m2} (ref >59)

## 2022-08-27 LAB — URIC ACID: Uric Acid: 4 mg/dL (ref 3.8–8.4)

## 2022-08-27 NOTE — Progress Notes (Signed)
Spoke with pt's wife regarding the results of his labs. Advised if symptoms persist or worsening to discontinue steroid and stay on colchicine. She reported that his symptoms improved since yesterday.

## 2022-09-12 ENCOUNTER — Ambulatory Visit: Payer: No Typology Code available for payment source | Admitting: Physician Assistant

## 2022-09-12 NOTE — Progress Notes (Deleted)
Established patient visit   Patient: Evan Valencia   DOB: 1955/12/08   67 y.o. Male  MRN: [id number] Visit Date: 09/12/2022  Today's healthcare provider: [name], PA-C   No chief complaint on file.  Subjective    HPI  Follow up for Gout  The patient was last seen for this 2 weeks ago. Changes made at last visit include Start taking prednisone while continue taking allopurinol - colchicine 0.6 MG tablet; Take 1 tablet (0.6 mg total) by mouth daily.  Dispense: 30 tablet; Refill: 0 - Uric acid - Basic Metabolic Panel (BMET) - predniSONE (DELTASONE) 20 MG tablet; Take 1 tablet (20 mg total) by mouth daily with breakfast.  Dispense: 10 tablet; Refill: 0 - allopurinol (ZYLOPRIM) 100 MG tablet; Take 1 tablet (100 mg total) by mouth daily.  Dispense: 90 tablet; Refill: 0.  He reports {excellent/good/fair/poor:19665} compliance with treatment. He feels that condition is {improved/worse/unchanged:3041574}. He {is/is not:21021397} having side effects. ***  -----------------------------------------------------------------------------------------   Medications: Outpatient Medications Prior to Visit  Medication Sig  . allopurinol (ZYLOPRIM) 100 MG tablet Take 1 tablet (100 mg total) by mouth daily.  Marland Kitchen atorvastatin (LIPITOR) 10 MG tablet Take 1 tablet (10 mg total) by mouth daily.  . colchicine 0.6 MG tablet Take 1 tablet (0.6 mg total) by mouth daily.  . predniSONE (DELTASONE) 20 MG tablet Take 1 tablet (20 mg total) by mouth daily with breakfast.  . sildenafil (VIAGRA) 50 MG tablet Take 1 tablet (50 mg total) by mouth daily as needed for erectile dysfunction. (Patient not taking: Reported on 08/26/2022)  . tadalafil (CIALIS) 5 MG tablet Take 1 tablet (5 mg total) by mouth daily.   No facility-administered medications prior to visit.    Review of Systems  All other systems reviewed and are negative.  {Labs  Heme  Chem  Endocrine  Serology  Results Review  (optional):23779}   Objective    There were no vitals taken for this visit. {Show previous vital signs (optional):23777}  Physical Exam Vitals reviewed.  Constitutional:      General: He is not in acute distress.    Appearance: Normal appearance. He is not diaphoretic.  HENT:     Head: Normocephalic and atraumatic.  Eyes:     General: No scleral icterus.    Conjunctiva/sclera: Conjunctivae normal.  Cardiovascular:     Rate and Rhythm: Normal rate and regular rhythm.     Pulses: Normal pulses.     Heart sounds: Normal heart sounds. No murmur heard. Pulmonary:     Effort: Pulmonary effort is normal. No respiratory distress.     Breath sounds: Normal breath sounds. No wheezing or rhonchi.  Musculoskeletal:     Cervical back: Neck supple.     Right lower leg: No edema.     Left lower leg: No edema.  Lymphadenopathy:     Cervical: No cervical adenopathy.  Skin:    General: Skin is warm and dry.     Findings: No rash.  Neurological:     Mental Status: He is alert and oriented to person, place, and time. Mental status is at baseline.  Psychiatric:        Mood and Affect: Mood normal.        Behavior: Behavior normal.    ***  No results found for any visits on 09/12/22.  Assessment & Plan     ***  No follow-ups on file.      The patient was advised to  call back or seek an in-person evaluation if the symptoms worsen or if the condition fails to improve as anticipated.  I discussed the assessment and treatment plan with the patient. The patient was provided an opportunity to ask questions and all were answered. The patient agreed with the plan and demonstrated an understanding of the instructions.  I, [name], PA-C have reviewed all documentation for this visit. The documentation on 09/12/22  for the exam, diagnosis, procedures, and orders are all accurate and complete.  [name], [facility], MMS [facility] [phone] (phone) [phone]  (fax)   [facility]

## 2022-09-17 ENCOUNTER — Other Ambulatory Visit: Payer: Self-pay | Admitting: Physician Assistant

## 2022-09-17 DIAGNOSIS — M1049 Other secondary gout, multiple sites: Secondary | ICD-10-CM

## 2022-09-17 NOTE — Telephone Encounter (Signed)
Last rx: 08/26/2022 gty 30 with 0 refills Last ov 08/26/2022 No future ov scheduled

## 2022-10-06 ENCOUNTER — Other Ambulatory Visit: Payer: Self-pay | Admitting: Physician Assistant

## 2022-10-06 DIAGNOSIS — N529 Male erectile dysfunction, unspecified: Secondary | ICD-10-CM

## 2022-10-06 DIAGNOSIS — M1049 Other secondary gout, multiple sites: Secondary | ICD-10-CM

## 2022-10-07 NOTE — Telephone Encounter (Signed)
Unable to refill per protocol, Rx request is too soon.  Requested Prescriptions  Pending Prescriptions Disp Refills   tadalafil (CIALIS) 5 MG tablet [Pharmacy Med Name: TADALAFIL 5 MG TABLET] 90 tablet 0    Sig: TAKE 1 TABLET (5 MG TOTAL) BY MOUTH DAILY.     Urology: Erectile Dysfunction Agents Failed - 10/06/2022  4:16 PM      Failed - AST in normal range and within 360 days    AST  Date Value Ref Range Status  09/06/2021 25 0 - 40 IU/L Final         Failed - ALT in normal range and within 360 days    ALT  Date Value Ref Range Status  09/06/2021 19 0 - 44 IU/L Final         Failed - Last BP in normal range    BP Readings from Last 1 Encounters:  08/26/22 (!) 140/86         Passed - Valid encounter within last 12 months    Recent Outpatient Visits           1 month ago Other secondary acute gout of multiple sites   [facility] [place], [place], PA-C   4 months ago Hyperuricemia   [place] [facility] Malva Limes, MD   9 months ago Tobacco chew use   [facility] [place], [place], PA-C   12 months ago Screening for colon cancer   [facility] [place], [place], PA-C   1 year ago Encounter for annual health examination   [place] [facility] [place], [place], PA-C               allopurinol (ZYLOPRIM) 100 MG tablet [Pharmacy Med Name: ALLOPURINOL 100 MG TABLET] 90 tablet 0    Sig: TAKE 1 TABLET BY MOUTH EVERY DAY     Endocrinology:  Gout Agents - allopurinol Failed - 10/06/2022  4:16 PM      Failed - CBC within normal limits and completed in the last 12 months    WBC  Date Value Ref Range Status  09/06/2021 7.1 3.4 - 10.8 x10E3/uL Final   RBC  Date Value Ref Range Status  09/06/2021 4.85 4.14 - 5.80 x10E6/uL Final   Hemoglobin  Date Value Ref Range Status  09/06/2021 14.8 13.0 - 17.7 g/dL Final   Hematocrit  Date Value Ref Range Status   09/06/2021 44.9 37.5 - 51.0 % Final   MCHC  Date Value Ref Range Status  09/06/2021 33.0 31.5 - 35.7 g/dL Final   [facility]  Date Value Ref Range Status  09/06/2021 30.5 26.6 - 33.0 pg Final   MCV  Date Value Ref Range Status  09/06/2021 93 79 - 97 fL Final   No results found for: "PLTCOUNTKUC", "LABPLAT", "POCPLA" RDW  Date Value Ref Range Status  09/06/2021 13.3 11.6 - 15.4 % Final         Passed - Uric Acid in normal range and within 360 days    Uric Acid  Date Value Ref Range Status  08/26/2022 4.0 3.8 - 8.4 mg/dL Final    Comment:               Therapeutic target for gout patients: <6.0         Passed - Cr in normal range and within 360 days    Creatinine, Ser  Date Value Ref Range Status  08/26/2022 0.87 0.76 -  1.27 mg/dL Final         Passed - Valid encounter within last 12 months    Recent Outpatient Visits           1 month ago Other secondary acute gout of multiple sites   [facility] [place], [place], PA-C   4 months ago Hyperuricemia   [facility] Malva Limes, MD   9 months ago Tobacco chew use   [place] [place], [place], [place]   12 months ago Screening for colon cancer   [place] [facility] [address] [place], [place], PA-C   1 year ago Encounter for annual health examination   [facility] [place], [place], [place]

## 2022-11-23 ENCOUNTER — Other Ambulatory Visit: Payer: Self-pay | Admitting: Physician Assistant

## 2022-11-23 DIAGNOSIS — N529 Male erectile dysfunction, unspecified: Secondary | ICD-10-CM

## 2022-11-23 DIAGNOSIS — M1049 Other secondary gout, multiple sites: Secondary | ICD-10-CM

## 2022-11-23 DIAGNOSIS — E7800 Pure hypercholesterolemia, unspecified: Secondary | ICD-10-CM

## 2022-11-24 NOTE — Telephone Encounter (Signed)
Requested medications are due for refill today.  yes  Requested medications are on the active medications list.  yes  Last refill. 08/2022 and 07/2022  Future visit scheduled.   no  Notes to clinic.  Expired/ missing labs.    Requested Prescriptions  Pending Prescriptions Disp Refills   allopurinol (ZYLOPRIM) 100 MG tablet [Pharmacy Med Name: ALLOPURINOL 100 MG TABLET] 90 tablet 0    Sig: TAKE 1 TABLET BY MOUTH EVERY DAY     Endocrinology:  Gout Agents - allopurinol Failed - 11/23/2022  9:14 PM      Failed - CBC within normal limits and completed in the last 12 months    WBC  Date Value Ref Range Status  09/06/2021 7.1 3.4 - 10.8 x10E3/uL Final   RBC  Date Value Ref Range Status  09/06/2021 4.85 4.14 - 5.80 x10E6/uL Final   Hemoglobin  Date Value Ref Range Status  09/06/2021 14.8 13.0 - 17.7 g/dL Final   Hematocrit  Date Value Ref Range Status  09/06/2021 44.9 37.5 - 51.0 % Final   MCHC  Date Value Ref Range Status  09/06/2021 33.0 31.5 - 35.7 g/dL Final   [facility]  Date Value Ref Range Status  09/06/2021 30.5 26.6 - 33.0 pg Final   MCV  Date Value Ref Range Status  09/06/2021 93 79 - 97 fL Final   No results found for: "PLTCOUNTKUC", "LABPLAT", "POCPLA" RDW  Date Value Ref Range Status  09/06/2021 13.3 11.6 - 15.4 % Final         Passed - Uric Acid in normal range and within 360 days    Uric Acid  Date Value Ref Range Status  08/26/2022 4.0 3.8 - 8.4 mg/dL Final    Comment:               Therapeutic target for gout patients: <6.0         Passed - Cr in normal range and within 360 days    Creatinine, Ser  Date Value Ref Range Status  08/26/2022 0.87 0.76 - 1.27 mg/dL Final         Passed - Valid encounter within last 12 months    Recent Outpatient Visits           3 months ago Other secondary acute gout of multiple sites   [facility] [place], [place], PA-C   6 months ago Hyperuricemia   [facility]  Malva Limes, MD   10 months ago Tobacco chew use   [facility] [place], [place], PA-C   1 year ago Screening for colon cancer   [place] [place], [place], PA-C   1 year ago Encounter for annual health examination   [place] [facility] [place], [place], PA-C               tadalafil (CIALIS) 5 MG tablet [Pharmacy Med Name: TADALAFIL 5 MG TABLET] 90 tablet 0    Sig: TAKE 1 TABLET (5 MG TOTAL) BY MOUTH DAILY.     Urology: Erectile Dysfunction Agents Failed - 11/23/2022  9:14 PM      Failed - AST in normal range and within 360 days    AST  Date Value Ref Range Status  09/06/2021 25 0 - 40 IU/L Final         Failed - ALT in normal range and within 360 days    ALT  Date Value Ref Range Status  09/06/2021 19 0 - 44 IU/L Final         Failed - Last BP in normal range    BP Readings from Last 1 Encounters:  08/26/22 (!) 140/86         Passed - Valid encounter within last 12 months    Recent Outpatient Visits           3 months ago Other secondary acute gout of multiple sites   [facility] [place], [place], PA-C   6 months ago Hyperuricemia   [facility] Malva Limes, MD   10 months ago Tobacco chew use   [place] [facility] [place], [place], PA-C   1 year ago Screening for colon cancer   [place] [facility] [place], [place], PA-C   1 year ago Encounter for annual health examination   [facility] [place], [place], [place]              Refused Prescriptions Disp Refills   atorvastatin (LIPITOR) 10 MG tablet [Pharmacy Med Name: ATORVASTATIN 10 MG TABLET] 90 tablet 3    Sig: TAKE 1 TABLET BY MOUTH EVERY DAY     Cardiovascular:  Antilipid - Statins Failed - 11/23/2022  9:14 PM      Failed - Lipid Panel in normal range within the last 12 months    Cholesterol, Total  Date Value Ref Range  Status  01/10/2022 172 100 - 199 mg/dL Final   LDL Chol Calc (NIH)  Date Value Ref Range Status  01/10/2022 97 0 - 99 mg/dL Final   HDL  Date Value Ref Range Status  01/10/2022 48 >39 mg/dL Final   Triglycerides  Date Value Ref Range Status  01/10/2022 154 (H) 0 - 149 mg/dL Final         Passed - Patient is not pregnant      Passed - Valid encounter within last 12 months    Recent Outpatient Visits           3 months ago Other secondary acute gout of multiple sites   [facility] [place] [place], [place], PA-C   6 months ago Hyperuricemia   [facility] Mal[facility], MD   10 months ago Tobacco chew use   [place] [facility] [place], [place], PA-C   1 year ago Screening for colon cancer   [facility] [place], [place], PA-C   1 year ago Encounter for annual health examination   [facility] [place], [place], [place]

## 2022-11-24 NOTE — Telephone Encounter (Signed)
Requested Prescriptions  Pending Prescriptions Disp Refills   allopurinol (ZYLOPRIM) 100 MG tablet [Pharmacy Med Name: ALLOPURINOL 100 MG TABLET] 90 tablet 0    Sig: TAKE 1 TABLET BY MOUTH EVERY DAY     Endocrinology:  Gout Agents - allopurinol Failed - 11/23/2022  9:14 PM      Failed - CBC within normal limits and completed in the last 12 months    WBC  Date Value Ref Range Status  09/06/2021 7.1 3.4 - 10.8 x10E3/uL Final   RBC  Date Value Ref Range Status  09/06/2021 4.85 4.14 - 5.80 x10E6/uL Final   Hemoglobin  Date Value Ref Range Status  09/06/2021 14.8 13.0 - 17.7 g/dL Final   Hematocrit  Date Value Ref Range Status  09/06/2021 44.9 37.5 - 51.0 % Final   MCHC  Date Value Ref Range Status  09/06/2021 33.0 31.5 - 35.7 g/dL Final   [facility]  Date Value Ref Range Status  09/06/2021 30.5 26.6 - 33.0 pg Final   MCV  Date Value Ref Range Status  09/06/2021 93 79 - 97 fL Final   No results found for: "PLTCOUNTKUC", "LABPLAT", "POCPLA" RDW  Date Value Ref Range Status  09/06/2021 13.3 11.6 - 15.4 % Final         Passed - Uric Acid in normal range and within 360 days    Uric Acid  Date Value Ref Range Status  08/26/2022 4.0 3.8 - 8.4 mg/dL Final    Comment:               Therapeutic target for gout patients: <6.0         Passed - Cr in normal range and within 360 days    Creatinine, Ser  Date Value Ref Range Status  08/26/2022 0.87 0.76 - 1.27 mg/dL Final         Passed - Valid encounter within last 12 months    Recent Outpatient Visits           3 months ago Other secondary acute gout of multiple sites   [facility] [place], [place], PA-C   6 months ago Hyperuricemia   [facility] Mal[facility] Limes, MD   10 months ago Tobacco chew use   [place] [facility] [place], [place], PA-C   1 year ago Screening for colon cancer   [place] [facility] [place], [place], PA-C   1 year ago  Encounter for annual health examination   [place] [facility] [place], [place], PA-C               tadalafil (CIALIS) 5 MG tablet [Pharmacy Med Name: TADALAFIL 5 MG TABLET] 90 tablet 0    Sig: TAKE 1 TABLET (5 MG TOTAL) BY MOUTH DAILY.     Urology: Erectile Dysfunction Agents Failed - 11/23/2022  9:14 PM      Failed - AST in normal range and within 360 days    AST  Date Value Ref Range Status  09/06/2021 25 0 - 40 IU/L Final         Failed - ALT in normal range and within 360 days    ALT  Date Value Ref Range Status  09/06/2021 19 0 - 44 IU/L Final         Failed - Last BP in normal range    BP Readings from Last 1 Encounters:  08/26/22 (!) 140/86  Passed - Valid encounter within last 12 months    Recent Outpatient Visits           3 months ago Other secondary acute gout of multiple sites   [facility], [place], PA-C   6 months ago Hyperuricemia   [facility], MD   10 months ago Tobacco chew use   [place] [place], [place], PA-C   1 year ago Screening for colon cancer   [place] [place], [place], PA-C   1 year ago Encounter for annual health examination   [facility] [place], [place], [place]              Refused Prescriptions Disp Refills   atorvastatin (LIPITOR) 10 MG tablet [Pharmacy Med Name: ATORVASTATIN 10 MG TABLET] 90 tablet 3    Sig: TAKE 1 TABLET BY MOUTH EVERY DAY     Cardiovascular:  Antilipid - Statins Failed - 11/23/2022  9:14 PM      Failed - Lipid Panel in normal range within the last 12 months    Cholesterol, Total  Date Value Ref Range Status  01/10/2022 172 100 - 199 mg/dL Final   LDL Chol Calc (NIH)  Date Value Ref Range Status  01/10/2022 97 0 - 99 mg/dL Final   HDL  Date Value Ref Range Status  01/10/2022 48 >39 mg/dL Final   Triglycerides  Date Value  Ref Range Status  01/10/2022 154 (H) 0 - 149 mg/dL Final         Passed - Patient is not pregnant      Passed - Valid encounter within last 12 months    Recent Outpatient Visits           3 months ago Other secondary acute gout of multiple sites   [facility] [place], [place], PA-C   6 months ago Hyperuricemia   [facility] Mal[facility] Limes, MD   10 months ago Tobacco chew use   [facility] [place], [place], PA-C   1 year ago Screening for colon cancer    [facility] [place], [place], PA-C   1 year ago Encounter for annual health examination   [facility] [place], [place], [place]

## 2022-11-28 ENCOUNTER — Other Ambulatory Visit: Payer: Self-pay | Admitting: Physician Assistant

## 2022-11-28 DIAGNOSIS — N529 Male erectile dysfunction, unspecified: Secondary | ICD-10-CM

## 2022-11-28 DIAGNOSIS — M1049 Other secondary gout, multiple sites: Secondary | ICD-10-CM

## 2022-11-28 DIAGNOSIS — E7800 Pure hypercholesterolemia, unspecified: Secondary | ICD-10-CM

## 2022-11-28 NOTE — Telephone Encounter (Signed)
Medication Refill - Medication: sildenafil (VIAGRA) 50 MG tablet /allopurinol (ZYLOPRIM) 100 MG /atorvastatin (LIPITOR) 10 MG table  Denied , said refill too soon, but pt is al out  Has the patient contacted their pharmacy? yes (Agent: If no, request that the patient contact the pharmacy for the refill. If patient does not wish to contact the pharmacy document the reason why and proceed with request.) (Agent: If yes, when and what did the pharmacy advise?)contact pcp  Preferred Pharmacy (with phone number or street name): [id number] [place], [place] - [address]  Phone: [phone] Fax: [phone] Has the patient been seen for an appointment in the last year OR does the patient have an upcoming appointment? yes  Agent: Please be advised that RX refills may take up to 3 business days. We ask that you follow-up with your pharmacy.

## 2022-12-01 MED ORDER — SILDENAFIL CITRATE 50 MG PO TABS: 50.0000 mg | ORAL_TABLET | Freq: Every day | ORAL | 1 refills | Status: DC | PRN

## 2022-12-01 MED ORDER — ATORVASTATIN CALCIUM 10 MG PO TABS
10.0000 mg | ORAL_TABLET | Freq: Every day | ORAL | 3 refills | Status: DC
Start: 2022-12-01 — End: 2023-12-07

## 2022-12-01 MED ORDER — ALLOPURINOL 100 MG PO TABS
100.0000 mg | ORAL_TABLET | Freq: Every day | ORAL | 0 refills | Status: DC
Start: 2022-12-01 — End: 2023-06-09

## 2022-12-01 NOTE — Telephone Encounter (Signed)
Requested Prescriptions  Pending Prescriptions Disp Refills   allopurinol (ZYLOPRIM) 100 MG tablet 90 tablet 0    Sig: Take 1 tablet (100 mg total) by mouth daily.     Endocrinology:  Gout Agents - allopurinol Failed - 11/28/2022  4:53 PM      Failed - CBC within normal limits and completed in the last 12 months    WBC  Date Value Ref Range Status  09/06/2021 7.1 3.4 - 10.8 x10E3/uL Final   RBC  Date Value Ref Range Status  09/06/2021 4.85 4.14 - 5.80 x10E6/uL Final   Hemoglobin  Date Value Ref Range Status  09/06/2021 14.8 13.0 - 17.7 g/dL Final   Hematocrit  Date Value Ref Range Status  09/06/2021 44.9 37.5 - 51.0 % Final   MCHC  Date Value Ref Range Status  09/06/2021 33.0 31.5 - 35.7 g/dL Final   [facility]  Date Value Ref Range Status  09/06/2021 30.5 26.6 - 33.0 pg Final   MCV  Date Value Ref Range Status  09/06/2021 93 79 - 97 fL Final   No results found for: "PLTCOUNTKUC", "LABPLAT", "POCPLA" RDW  Date Value Ref Range Status  09/06/2021 13.3 11.6 - 15.4 % Final         Passed - Uric Acid in normal range and within 360 days    Uric Acid  Date Value Ref Range Status  08/26/2022 4.0 3.8 - 8.4 mg/dL Final    Comment:               Therapeutic target for gout patients: <6.0         Passed - Cr in normal range and within 360 days    Creatinine, Ser  Date Value Ref Range Status  08/26/2022 0.87 0.76 - 1.27 mg/dL Final         Passed - Valid encounter within last 12 months    Recent Outpatient Visits           3 months ago Other secondary acute gout of multiple sites   [facility] [place], [place], PA-C   6 months ago Hyperuricemia   [facility] Malva Limes, MD   10 months ago Tobacco chew use   [facility] [place], [place], PA-C   1 year ago Screening for colon cancer   [place] [facility] [employer], [place], PA-C   1 year ago Encounter for annual health  examination   [facility] [place], [place], PA-C               atorvastatin (LIPITOR) 10 MG tablet 90 tablet 3    Sig: Take 1 tablet (10 mg total) by mouth daily.     Cardiovascular:  Antilipid - Statins Failed - 11/28/2022  4:53 PM      Failed - Lipid Panel in normal range within the last 12 months    Cholesterol, Total  Date Value Ref Range Status  01/10/2022 172 100 - 199 mg/dL Final   LDL Chol Calc (NIH)  Date Value Ref Range Status  01/10/2022 97 0 - 99 mg/dL Final   HDL  Date Value Ref Range Status  01/10/2022 48 >39 mg/dL Final   Triglycerides  Date Value Ref Range Status  01/10/2022 154 (H) 0 - 149 mg/dL Final         Passed - Patient is not pregnant      Passed - Valid encounter within last 12 months  Recent Outpatient Visits           3 months ago Other secondary acute gout of multiple sites   [facility] [place], [place], [place]   6 months ago Hyperuricemia   [facility] Malva Limes, MD   10 months ago Tobacco chew use   [place] [facility] [place], [place], PA-C   1 year ago Screening for colon cancer   [facility] [place], [place], PA-C   1 year ago Encounter for annual health examination   [facility] [place], [place], PA-C               sildenafil (VIAGRA) 50 MG tablet 30 tablet 1    Sig: Take 1 tablet (50 mg total) by mouth daily as needed for erectile dysfunction.     Urology: Erectile Dysfunction Agents Failed - 11/28/2022  4:53 PM      Failed - AST in normal range and within 360 days    AST  Date Value Ref Range Status  09/06/2021 25 0 - 40 IU/L Final         Failed - ALT in normal range and within 360 days    ALT  Date Value Ref Range Status  09/06/2021 19 0 - 44 IU/L Final         Failed - Last BP in normal range    BP Readings from Last 1 Encounters:  08/26/22 (!) 140/86          Passed - Valid encounter within last 12 months    Recent Outpatient Visits           3 months ago Other secondary acute gout of multiple sites   [facility] [place], [place], PA-C   6 months ago Hyperuricemia   [facility] Malva Limes, MD   10 months ago Tobacco chew use   [facility] [place], [place], PA-C   1 year ago Screening for colon cancer   [facility] [place], [place], PA-C   1 year ago Encounter for annual health examination   [facility], [place], [place]

## 2023-06-08 ENCOUNTER — Other Ambulatory Visit: Payer: Self-pay | Admitting: Physician Assistant

## 2023-06-08 DIAGNOSIS — M1049 Other secondary gout, multiple sites: Secondary | ICD-10-CM

## 2023-09-06 ENCOUNTER — Other Ambulatory Visit: Payer: Self-pay | Admitting: Physician Assistant

## 2023-09-06 DIAGNOSIS — M1049 Other secondary gout, multiple sites: Secondary | ICD-10-CM

## 2023-12-07 ENCOUNTER — Other Ambulatory Visit: Payer: Self-pay | Admitting: Physician Assistant

## 2023-12-07 DIAGNOSIS — E7800 Pure hypercholesterolemia, unspecified: Secondary | ICD-10-CM

## 2023-12-07 DIAGNOSIS — M1049 Other secondary gout, multiple sites: Secondary | ICD-10-CM

## 2024-02-26 ENCOUNTER — Other Ambulatory Visit: Payer: Self-pay | Admitting: Physician Assistant

## 2024-02-26 DIAGNOSIS — N529 Male erectile dysfunction, unspecified: Secondary | ICD-10-CM

## 2024-02-26 NOTE — Telephone Encounter (Signed)
 Copied from CRM [id number]. Topic: Clinical - Medication Refill >> Feb 26, 2024  3:17 PM Delon DASEN wrote: Medication: sildenafil  (VIAGRA ) 50 MG tablet   Has the patient contacted their pharmacy? Yes (Agent: If no, request that the patient contact the pharmacy for the refill. If patient does not wish to contact the pharmacy document the reason why and proceed with request.) (Agent: If yes, when and what did the pharmacy advise?)  This is the patient's preferred pharmacy[facility] [address] [place] [facility] [address] [place] [address] Phone: [phone] Fax: [phone]  Is this the correct pharmacy for this prescription? Yes If no, delete pharmacy and type the correct one.   Has the prescription been filled recently? Yes  Is the patient out of the medication? Yes  Has the patient been seen for an appointment in the last year OR does the patient have an upcoming appointment? Yes  Can we respond through MyChart? Yes  Agent: Please be advised that Rx refills may take up to 3 business days. We ask that you follow-up with your pharmacy.

## 2024-02-29 NOTE — Telephone Encounter (Signed)
 Requested medications are due for refill today.  yes  Requested medications are on the active medications list.  yes  Last refill. 12/01/2022 #30 1 rf  Future visit scheduled.   no  Notes to clinic.  Pt is more than 3 months over due for an ov.    Requested Prescriptions  Pending Prescriptions Disp Refills   sildenafil  (VIAGRA ) 50 MG tablet 30 tablet 1    Sig: Take 1 tablet (50 mg total) by mouth daily as needed for erectile dysfunction.     Urology: Erectile Dysfunction Agents Failed - 02/29/2024 12:23 PM      Failed - AST in normal range and within 360 days    AST  Date Value Ref Range Status  09/06/2021 25 0 - 40 IU/L Final         Failed - ALT in normal range and within 360 days    ALT  Date Value Ref Range Status  09/06/2021 19 0 - 44 IU/L Final         Failed - Last BP in normal range    BP Readings from Last 1 Encounters:  08/26/22 (!) 140/86         Failed - Valid encounter within last 12 months    Recent Outpatient Visits   None

## 2024-03-07 ENCOUNTER — Other Ambulatory Visit: Payer: Self-pay | Admitting: Physician Assistant

## 2024-03-07 DIAGNOSIS — M1049 Other secondary gout, multiple sites: Secondary | ICD-10-CM

## 2024-03-30 ENCOUNTER — Other Ambulatory Visit: Payer: Self-pay | Admitting: Physician Assistant

## 2024-03-30 DIAGNOSIS — M1049 Other secondary gout, multiple sites: Secondary | ICD-10-CM

## 2024-04-02 NOTE — Progress Notes (Unsigned)
 Established patient visit   Patient: Evan Valencia   DOB: 1956-04-15   68 y.o. Male  MRN: [id number] Visit Date: 04/04/2024  Today's healthcare provider: Rockie Agent, MD   No chief complaint on file.  Subjective       Discussed the use of AI scribe software for clinical note transcription with the patient, who gave verbal consent to proceed.  History of Present Illness      Past Medical History:  Diagnosis Date   ED (erectile dysfunction)    Gout     Medications: Show/hide medication list[1]  Review of Systems  Last CBC Lab Results  Component Value Date   WBC 7.1 09/06/2021   HGB 14.8 09/06/2021   HCT 44.9 09/06/2021   MCV 93 09/06/2021   MCH 30.5 09/06/2021   RDW 13.3 09/06/2021   PLT 271 09/06/2021   Last metabolic panel Lab Results  Component Value Date   GLUCOSE 86 08/26/2022   NA 142 08/26/2022   K 4.7 08/26/2022   CL 104 08/26/2022   CO2 23 08/26/2022   BUN 10 08/26/2022   CREATININE 0.87 08/26/2022   EGFR 96 08/26/2022   CALCIUM  9.4 08/26/2022   PHOS 3.0 01/10/2022   PROT 7.0 09/06/2021   ALBUMIN 4.5 01/10/2022   LABGLOB 2.6 09/06/2021   AGRATIO 1.7 09/06/2021   BILITOT 0.4 09/06/2021   ALKPHOS 105 09/06/2021   AST 25 09/06/2021   ALT 19 09/06/2021   Last lipids Lab Results  Component Value Date   CHOL 172 01/10/2022   HDL 48 01/10/2022   LDLCALC 97 01/10/2022   TRIG 154 (H) 01/10/2022   CHOLHDL 3.6 01/10/2022   Last hemoglobin A1c Lab Results  Component Value Date   HGBA1C 5.4 09/06/2021   Last thyroid functions Lab Results  Component Value Date   TSH 2.650 09/06/2021     {See past labs  Heme  Chem  Endocrine  Serology  Results Review (optional):1}   Objective    There were no vitals taken for this visit. BP Readings from Last 3 Encounters:  08/26/22 (!) 140/86  05/16/22 (!) 151/80  01/10/22 (!) 136/7   Wt Readings from Last 3 Encounters:  08/26/22 217 lb 12.8 oz (98.8 kg)  05/16/22  233 lb 11.2 oz (106 kg)  01/10/22 213 lb (96.6 kg)    {See vitals history (optional):1}    Physical Exam  ***  No results found for any visits on 04/04/24.  Assessment & Plan     Problem List Items Addressed This Visit   None   Assessment and Plan Assessment & Plan      No follow-ups on file.         Rockie Agent, MD  [facility] [phone] (phone) [phone] (fax)  [facility]    [1]  Outpatient Medications Prior to Visit  Medication Sig   allopurinol  (ZYLOPRIM ) 100 MG tablet TAKE 1 TABLET BY MOUTH EVERY DAY   atorvastatin  (LIPITOR) 10 MG tablet TAKE 1 TABLET BY MOUTH EVERY DAY   colchicine  0.6 MG tablet TAKE 1 TABLET BY MOUTH EVERY DAY   predniSONE  (DELTASONE ) 20 MG tablet Take 1 tablet (20 mg total) by mouth daily with breakfast.   sildenafil  (VIAGRA ) 50 MG tablet Take 1 tablet (50 mg total) by mouth daily as needed for erectile dysfunction.   tadalafil  (CIALIS ) 5 MG tablet TAKE 1 TABLET (5 MG TOTAL) BY MOUTH DAILY.   No facility-administered medications prior  to visit.

## 2024-04-04 ENCOUNTER — Ambulatory Visit: Admitting: Family Medicine

## 2024-04-04 ENCOUNTER — Encounter: Payer: Self-pay | Admitting: Family Medicine

## 2024-04-04 VITALS — BP 150/92 | HR 73 | Ht 72.0 in | Wt 240.2 lb

## 2024-04-04 DIAGNOSIS — M1049 Other secondary gout, multiple sites: Secondary | ICD-10-CM

## 2024-04-04 DIAGNOSIS — M1A0620 Idiopathic chronic gout, left knee, without tophus (tophi): Secondary | ICD-10-CM

## 2024-04-04 DIAGNOSIS — E7800 Pure hypercholesterolemia, unspecified: Secondary | ICD-10-CM

## 2024-04-04 DIAGNOSIS — Z720 Tobacco use: Secondary | ICD-10-CM

## 2024-04-04 DIAGNOSIS — I10 Essential (primary) hypertension: Secondary | ICD-10-CM

## 2024-04-04 DIAGNOSIS — Z1211 Encounter for screening for malignant neoplasm of colon: Secondary | ICD-10-CM

## 2024-04-04 DIAGNOSIS — F1090 Alcohol use, unspecified, uncomplicated: Secondary | ICD-10-CM

## 2024-04-04 DIAGNOSIS — N529 Male erectile dysfunction, unspecified: Secondary | ICD-10-CM

## 2024-04-04 DIAGNOSIS — E790 Hyperuricemia without signs of inflammatory arthritis and tophaceous disease: Secondary | ICD-10-CM

## 2024-04-04 MED ORDER — SILDENAFIL CITRATE 100 MG PO TABS: 100.0000 mg | ORAL_TABLET | Freq: Every day | ORAL | 2 refills | Status: AC | PRN

## 2024-04-04 MED ORDER — ALLOPURINOL 100 MG PO TABS: 100.0000 mg | ORAL_TABLET | Freq: Every day | ORAL | 0 refills | Status: DC

## 2024-04-04 MED ORDER — COLCHICINE 0.6 MG PO TABS: 0.6000 mg | ORAL_TABLET | Freq: Every day | ORAL | 0 refills | Status: AC

## 2024-04-04 MED ORDER — ATORVASTATIN CALCIUM 10 MG PO TABS: 10.0000 mg | ORAL_TABLET | Freq: Every day | ORAL | 3 refills | Status: AC

## 2024-04-04 NOTE — Patient Instructions (Addendum)
 You can visit the lab without an appointment Mon-Fri between 8A-11:30A or 1P-4:30P.    To keep you healthy, please keep in mind the following health maintenance items that you are due for:   Health Maintenance Due  Topic Date Due   Medicare Annual Wellness (AWV)  Never done   Colonoscopy  Never done   Pneumococcal Vaccine: 50+ Years (1 of 1 - PCV) Never done   Influenza Vaccine  Never done     Best Wishes,   Dr. Lang

## 2024-04-05 LAB — COMPREHENSIVE METABOLIC PANEL WITH GFR
ALT: 55 IU/L — ABNORMAL HIGH (ref 0–44)
AST: 49 IU/L — ABNORMAL HIGH (ref 0–40)
Albumin: 4.5 g/dL (ref 3.9–4.9)
Alkaline Phosphatase: 90 IU/L (ref 47–123)
BUN/Creatinine Ratio: 11 (ref 10–24)
BUN: 13 mg/dL (ref 8–27)
Bilirubin Total: 0.4 mg/dL (ref 0.0–1.2)
CO2: 20 mmol/L (ref 20–29)
Calcium: 9.4 mg/dL (ref 8.6–10.2)
Chloride: 104 mmol/L (ref 96–106)
Creatinine, Ser: 1.19 mg/dL (ref 0.76–1.27)
Globulin, Total: 2.5 g/dL (ref 1.5–4.5)
Glucose: 95 mg/dL (ref 70–99)
Potassium: 4.4 mmol/L (ref 3.5–5.2)
Sodium: 142 mmol/L (ref 134–144)
Total Protein: 7 g/dL (ref 6.0–8.5)
eGFR: 67 mL/min/1.73 (ref >59)

## 2024-04-05 LAB — LIPID PANEL
Chol/HDL Ratio: 3.7 ratio (ref 0.0–5.0)
Cholesterol, Total: 174 mg/dL (ref 100–199)
HDL: 47 mg/dL (ref >39)
LDL Chol Calc (NIH): 94 mg/dL (ref 0–99)
Triglycerides: 194 mg/dL — ABNORMAL HIGH (ref 0–149)
VLDL Cholesterol Cal: 33 mg/dL (ref 5–40)

## 2024-04-05 LAB — URIC ACID: Uric Acid: 6.3 mg/dL (ref 3.8–8.4)

## 2024-04-06 ENCOUNTER — Ambulatory Visit: Payer: Self-pay | Admitting: Family Medicine

## 2024-04-30 ENCOUNTER — Other Ambulatory Visit: Payer: Self-pay | Admitting: Family Medicine

## 2024-04-30 DIAGNOSIS — M1049 Other secondary gout, multiple sites: Secondary | ICD-10-CM

## 2024-05-13 ENCOUNTER — Encounter: Admitting: Physician Assistant

## 2024-06-10 ENCOUNTER — Encounter: Admitting: Physician Assistant
